# Patient Record
Sex: Female | Born: 1935 | Race: White | Hispanic: No | Marital: Single | Smoking: Former smoker
Health system: Southern US, Community
[De-identification: ages and names within clinical notes are randomized; demographics above are authoritative.]

## PROBLEM LIST (undated history)

## (undated) DIAGNOSIS — K5792 Diverticulitis of intestine, part unspecified, without perforation or abscess without bleeding: Secondary | ICD-10-CM

## (undated) DIAGNOSIS — I10 Essential (primary) hypertension: Secondary | ICD-10-CM

## (undated) DIAGNOSIS — E119 Type 2 diabetes mellitus without complications: Secondary | ICD-10-CM

## (undated) HISTORY — PX: APPENDECTOMY: SHX54

## (undated) HISTORY — PX: TONSILLECTOMY: SUR1361

---

## 1998-03-26 ENCOUNTER — Ambulatory Visit (HOSPITAL_COMMUNITY): Admission: RE | Admit: 1998-03-26 | Discharge: 1998-03-26 | Payer: Self-pay | Admitting: Family Medicine

## 1998-03-26 ENCOUNTER — Encounter: Payer: Self-pay | Admitting: Family Medicine

## 2002-07-01 ENCOUNTER — Encounter: Payer: Self-pay | Admitting: Family Medicine

## 2002-07-01 ENCOUNTER — Encounter: Admission: RE | Admit: 2002-07-01 | Discharge: 2002-07-01 | Payer: Self-pay | Admitting: Family Medicine

## 2004-02-26 ENCOUNTER — Emergency Department (HOSPITAL_COMMUNITY): Admission: EM | Admit: 2004-02-26 | Discharge: 2004-02-26 | Payer: Self-pay | Admitting: *Deleted

## 2010-05-31 ENCOUNTER — Emergency Department (INDEPENDENT_AMBULATORY_CARE_PROVIDER_SITE_OTHER): Payer: Medicare Other

## 2010-05-31 ENCOUNTER — Emergency Department (HOSPITAL_BASED_OUTPATIENT_CLINIC_OR_DEPARTMENT_OTHER)
Admission: EM | Admit: 2010-05-31 | Discharge: 2010-05-31 | Disposition: A | Discharge status: Home / Self Care | Payer: Medicare Other | Attending: Emergency Medicine | Admitting: Emergency Medicine

## 2010-05-31 DIAGNOSIS — S5000XA Contusion of unspecified elbow, initial encounter: Secondary | ICD-10-CM | POA: Insufficient documentation

## 2010-05-31 DIAGNOSIS — W19XXXA Unspecified fall, initial encounter: Secondary | ICD-10-CM | POA: Insufficient documentation

## 2010-05-31 DIAGNOSIS — Y92009 Unspecified place in unspecified non-institutional (private) residence as the place of occurrence of the external cause: Secondary | ICD-10-CM | POA: Insufficient documentation

## 2010-05-31 DIAGNOSIS — W108XXA Fall (on) (from) other stairs and steps, initial encounter: Secondary | ICD-10-CM

## 2010-05-31 DIAGNOSIS — M25529 Pain in unspecified elbow: Secondary | ICD-10-CM

## 2011-01-29 ENCOUNTER — Ambulatory Visit
Admission: RE | Admit: 2011-01-29 | Discharge: 2011-01-29 | Disposition: A | Discharge status: Home / Self Care | Payer: Medicare Other | Source: Ambulatory Visit | Attending: Family Medicine | Admitting: Family Medicine

## 2011-01-29 ENCOUNTER — Other Ambulatory Visit: Payer: Self-pay | Admitting: Family Medicine

## 2011-01-29 MED ORDER — IOHEXOL 300 MG/ML  SOLN
100.0000 mL | Freq: Once | INTRAMUSCULAR | Status: AC | PRN
  Administered 2011-01-29: 100 mL via INTRAVENOUS

## 2011-01-29 MED ORDER — IOHEXOL 300 MG/ML  SOLN
40.0000 mL | Freq: Once | INTRAMUSCULAR | Status: AC | PRN
  Administered 2011-01-29: 40 mL via ORAL

## 2011-02-04 ENCOUNTER — Other Ambulatory Visit: Payer: Self-pay | Admitting: Family Medicine

## 2011-02-14 ENCOUNTER — Ambulatory Visit
Admission: RE | Admit: 2011-02-14 | Discharge: 2011-02-14 | Disposition: A | Discharge status: Home / Self Care | Payer: Medicare Other | Source: Ambulatory Visit | Attending: Family Medicine | Admitting: Family Medicine

## 2011-08-01 ENCOUNTER — Other Ambulatory Visit: Payer: Self-pay | Admitting: Family Medicine

## 2011-08-01 DIAGNOSIS — R911 Solitary pulmonary nodule: Secondary | ICD-10-CM

## 2011-08-06 ENCOUNTER — Ambulatory Visit
Admission: RE | Admit: 2011-08-06 | Discharge: 2011-08-06 | Disposition: A | Discharge status: Home / Self Care | Payer: Medicare Other | Source: Ambulatory Visit | Attending: Family Medicine | Admitting: Family Medicine

## 2011-08-06 DIAGNOSIS — R911 Solitary pulmonary nodule: Secondary | ICD-10-CM

## 2012-08-17 ENCOUNTER — Other Ambulatory Visit: Payer: Self-pay | Admitting: Family Medicine

## 2012-08-17 DIAGNOSIS — R918 Other nonspecific abnormal finding of lung field: Secondary | ICD-10-CM

## 2012-08-19 ENCOUNTER — Other Ambulatory Visit: Payer: Medicare Other

## 2012-08-23 ENCOUNTER — Ambulatory Visit
Admission: RE | Admit: 2012-08-23 | Discharge: 2012-08-23 | Disposition: A | Discharge status: Home / Self Care | Payer: Medicare Other | Source: Ambulatory Visit | Attending: Family Medicine | Admitting: Family Medicine

## 2012-08-23 DIAGNOSIS — R918 Other nonspecific abnormal finding of lung field: Secondary | ICD-10-CM

## 2015-05-17 ENCOUNTER — Other Ambulatory Visit: Payer: Self-pay | Admitting: Nephrology

## 2015-05-17 DIAGNOSIS — N183 Chronic kidney disease, stage 3 unspecified: Secondary | ICD-10-CM

## 2015-05-21 ENCOUNTER — Ambulatory Visit
Admission: RE | Admit: 2015-05-21 | Discharge: 2015-05-21 | Disposition: A | Discharge status: Home / Self Care | Payer: Medicare Other | Source: Ambulatory Visit | Attending: Nephrology | Admitting: Nephrology

## 2015-05-21 DIAGNOSIS — N183 Chronic kidney disease, stage 3 unspecified: Secondary | ICD-10-CM

## 2016-05-19 ENCOUNTER — Emergency Department (HOSPITAL_COMMUNITY)
Admission: EM | Admit: 2016-05-19 | Discharge: 2016-05-19 | Disposition: A | Discharge status: Home / Self Care | Payer: Medicare Other | Attending: Emergency Medicine | Admitting: Emergency Medicine

## 2016-05-19 ENCOUNTER — Emergency Department (HOSPITAL_COMMUNITY): Payer: Medicare Other

## 2016-05-19 ENCOUNTER — Encounter (HOSPITAL_COMMUNITY): Payer: Self-pay | Admitting: *Deleted

## 2016-05-19 DIAGNOSIS — I10 Essential (primary) hypertension: Secondary | ICD-10-CM | POA: Insufficient documentation

## 2016-05-19 DIAGNOSIS — M545 Low back pain, unspecified: Secondary | ICD-10-CM

## 2016-05-19 DIAGNOSIS — E119 Type 2 diabetes mellitus without complications: Secondary | ICD-10-CM | POA: Insufficient documentation

## 2016-05-19 DIAGNOSIS — Z7982 Long term (current) use of aspirin: Secondary | ICD-10-CM | POA: Diagnosis not present

## 2016-05-19 DIAGNOSIS — R109 Unspecified abdominal pain: Secondary | ICD-10-CM | POA: Diagnosis not present

## 2016-05-19 DIAGNOSIS — F17210 Nicotine dependence, cigarettes, uncomplicated: Secondary | ICD-10-CM | POA: Diagnosis not present

## 2016-05-19 DIAGNOSIS — Z79899 Other long term (current) drug therapy: Secondary | ICD-10-CM | POA: Diagnosis not present

## 2016-05-19 HISTORY — DX: Type 2 diabetes mellitus without complications: E11.9

## 2016-05-19 HISTORY — DX: Essential (primary) hypertension: I10

## 2016-05-19 HISTORY — DX: Diverticulitis of intestine, part unspecified, without perforation or abscess without bleeding: K57.92

## 2016-05-19 LAB — URINALYSIS, ROUTINE W REFLEX MICROSCOPIC
Bilirubin Urine: NEGATIVE
Glucose, UA: NEGATIVE mg/dL
Hgb urine dipstick: NEGATIVE
Ketones, ur: NEGATIVE mg/dL
Leukocytes, UA: NEGATIVE
NITRITE: NEGATIVE
PH: 5 (ref 5.0–8.0)
Protein, ur: NEGATIVE mg/dL
SPECIFIC GRAVITY, URINE: 1.017 (ref 1.005–1.030)

## 2016-05-19 LAB — CBC
HCT: 40.6 % (ref 36.0–46.0)
Hemoglobin: 13.6 g/dL (ref 12.0–15.0)
MCH: 30.6 pg (ref 26.0–34.0)
MCHC: 33.5 g/dL (ref 30.0–36.0)
MCV: 91.4 fL (ref 78.0–100.0)
PLATELETS: 342 10*3/uL (ref 150–400)
RBC: 4.44 MIL/uL (ref 3.87–5.11)
RDW: 13 % (ref 11.5–15.5)
WBC: 7.4 10*3/uL (ref 4.0–10.5)

## 2016-05-19 LAB — BASIC METABOLIC PANEL
ANION GAP: 10 (ref 5–15)
Anion gap: 10 (ref 5–15)
BUN: 24 mg/dL — ABNORMAL HIGH (ref 6–20)
BUN: 23 mg/dL — ABNORMAL HIGH (ref 6–20)
CALCIUM: 9 mg/dL (ref 8.9–10.3)
CHLORIDE: 96 mmol/L — AB (ref 101–111)
CO2: 23 mmol/L (ref 22–32)
CO2: 25 mmol/L (ref 22–32)
Calcium: 8.9 mg/dL (ref 8.9–10.3)
Chloride: 96 mmol/L — ABNORMAL LOW (ref 101–111)
Creatinine, Ser: 1.95 mg/dL — ABNORMAL HIGH (ref 0.44–1.00)
Creatinine, Ser: 1.82 mg/dL — ABNORMAL HIGH (ref 0.44–1.00)
GFR calc non Af Amer: 22 mL/min — ABNORMAL LOW (ref >60)
GFR, EST AFRICAN AMERICAN: 26 mL/min — AB (ref >60)
GFR, EST AFRICAN AMERICAN: 28 mL/min — AB (ref >60)
GFR, EST NON AFRICAN AMERICAN: 24 mL/min — AB (ref >60)
Glucose, Bld: 128 mg/dL — ABNORMAL HIGH (ref 65–99)
Glucose, Bld: 90 mg/dL (ref 65–99)
POTASSIUM: 5.2 mmol/L — AB (ref 3.5–5.1)
POTASSIUM: 4.6 mmol/L (ref 3.5–5.1)
SODIUM: 129 mmol/L — AB (ref 135–145)
SODIUM: 131 mmol/L — AB (ref 135–145)

## 2016-05-19 MED ORDER — SODIUM CHLORIDE 0.9 % IV SOLN
INTRAVENOUS | Status: DC
  Administered 2016-05-19: 18:00:00 via INTRAVENOUS

## 2016-05-19 NOTE — ED Provider Notes (Signed)
[facility]-EMERGENCY DEPT Provider Note   CSN: [id number] Arrival date & time: 05/19/16  1127     History   Chief Complaint Chief Complaint  Patient presents with  . Flank Pain    HPI Cheryl Graves is a 83 y.o. female.  Patient evaluated by her primary care doctor on Friday had an x-ray that was suggestive of a left-sided kidney stone. Patient with complaint of left low back pain since then. Symptoms started on Thursday. Patient without any history of kidney stones. Urinalysis that time was suggestive of a urinary tract infection and patient was started on Bactrim. Patient's continued to have the pain continue have urinary frequency. Contacted her primary care doctor they recommended evaluation in ED. Patient was some nausea and diarrhea yesterday but no vomiting. No fevers. No blood in her urine currently. Patient's last dose the Bactrim was yesterday she did not complete a week's course.      Past Medical History:  Diagnosis Date  . Diabetes mellitus without complication (HCC)   . Diverticulitis   . Hypertension     There are no active problems to display for this patient.   Past Surgical History:  Procedure Laterality Date  . APPENDECTOMY      OB History    No data available       Home Medications    Prior to Admission medications   Medication Sig Start Date End Date Taking? Authorizing Provider  aspirin EC 81 MG tablet Take 81 mg by mouth at bedtime.   Yes Historical Provider, MD  sulfamethoxazole-trimethoprim (BACTRIM DS,SEPTRA DS) 800-160 MG tablet Take 1 tablet by mouth daily. 05/15/16 05/25/16  Historical Provider, MD    Family History No family history on file.  Social History Social History  Substance Use Topics  . Smoking status: Current Every Day Smoker  . Smokeless tobacco: Never Used  . Alcohol use No     Allergies   Ciprofloxacin; Metronidazole; and Septra [sulfamethoxazole-trimethoprim]   Review of Systems Review of Systems    Constitutional: Negative for fever.  HENT: Negative for congestion.   Eyes: Negative for redness.  Respiratory: Negative for shortness of breath.   Cardiovascular: Negative for chest pain.  Gastrointestinal: Negative for abdominal pain.  Genitourinary: Positive for flank pain and frequency.  Musculoskeletal: Positive for back pain.  Skin: Negative for rash.  Neurological: Negative for headaches.  Hematological: Does not bruise/bleed easily.  Psychiatric/Behavioral: Negative for confusion.     Physical Exam Updated Vital Signs BP (!) 134/95   Pulse 79   Temp 99 F (37.2 C) (Oral)   Resp 16   SpO2 100%   Physical Exam  Constitutional: She is oriented to person, place, and time. She appears well-developed and well-nourished. No distress.  HENT:  Head: Normocephalic and atraumatic.  Mouth/Throat: Oropharynx is clear and moist.  Eyes: EOM are normal. Pupils are equal, round, and reactive to light.  Neck: Normal range of motion. Neck supple.  Cardiovascular: Normal rate and regular rhythm.   Pulmonary/Chest: Effort normal and breath sounds normal.  Abdominal: Soft. Bowel sounds are normal. There is no tenderness.  Very slight very minimal tenderness left lower quadrant. No flank tenderness. No significant CVA tenderness.  Musculoskeletal: Normal range of motion. She exhibits no edema.  Neurological: She is alert and oriented to person, place, and time. No cranial nerve deficit or sensory deficit. She exhibits normal muscle tone. Coordination normal.  Skin: Skin is warm.  Nursing note and vitals reviewed.    ED Treatments /  Results  Labs (all labs ordered are listed, but only abnormal results are displayed) Labs Reviewed  URINALYSIS, ROUTINE W REFLEX MICROSCOPIC - Abnormal; Notable for the following:       Result Value   APPearance HAZY (*)    All other components within normal limits  BASIC METABOLIC PANEL - Abnormal; Notable for the following:    Sodium 129 (*)     Potassium 5.2 (*)    Chloride 96 (*)    Glucose, Bld 128 (*)    BUN 24 (*)    Creatinine, Ser 1.95 (*)    GFR calc non Af Amer 22 (*)    GFR calc Af Amer 26 (*)    All other components within normal limits  BASIC METABOLIC PANEL - Abnormal; Notable for the following:    Sodium 131 (*)    Chloride 96 (*)    BUN 23 (*)    Creatinine, Ser 1.82 (*)    GFR calc non Af Amer 24 (*)    GFR calc Af Amer 28 (*)    All other components within normal limits  CBC   Results for orders placed or performed during the hospital encounter of 05/19/16  Urinalysis, Routine w reflex microscopic- may I&O cath if menses  Result Value Ref Range   Color, Urine YELLOW YELLOW   APPearance HAZY (A) CLEAR   Specific Gravity, Urine 1.017 1.005 - 1.030   pH 5.0 5.0 - 8.0   Glucose, UA NEGATIVE NEGATIVE mg/dL   Hgb urine dipstick NEGATIVE NEGATIVE   Bilirubin Urine NEGATIVE NEGATIVE   Ketones, ur NEGATIVE NEGATIVE mg/dL   Protein, ur NEGATIVE NEGATIVE mg/dL   Nitrite NEGATIVE NEGATIVE   Leukocytes, UA NEGATIVE NEGATIVE  Basic metabolic panel  Result Value Ref Range   Sodium 129 (L) 135 - 145 mmol/L   Potassium 5.2 (H) 3.5 - 5.1 mmol/L   Chloride 96 (L) 101 - 111 mmol/L   CO2 23 22 - 32 mmol/L   Glucose, Bld 128 (H) 65 - 99 mg/dL   BUN 24 (H) 6 - 20 mg/dL   Creatinine, Ser 1.61 (H) 0.44 - 1.00 mg/dL   Calcium 8.9 8.9 - 09.6 mg/dL   GFR calc non Af Amer 22 (L) >60 mL/min   GFR calc Af Amer 26 (L) >60 mL/min   Anion gap 10 5 - 15  CBC  Result Value Ref Range   WBC 7.4 4.0 - 10.5 K/uL   RBC 4.44 3.87 - 5.11 MIL/uL   Hemoglobin 13.6 12.0 - 15.0 g/dL   HCT 04.5 40.9 - 81.1 %   MCV 91.4 78.0 - 100.0 fL   MCH 30.6 26.0 - 34.0 pg   MCHC 33.5 30.0 - 36.0 g/dL   RDW 91.4 78.2 - 95.6 %   Platelets 342 150 - 400 K/uL  Basic metabolic panel  Result Value Ref Range   Sodium 131 (L) 135 - 145 mmol/L   Potassium 4.6 3.5 - 5.1 mmol/L   Chloride 96 (L) 101 - 111 mmol/L   CO2 25 22 - 32 mmol/L   Glucose,  Bld 90 65 - 99 mg/dL   BUN 23 (H) 6 - 20 mg/dL   Creatinine, Ser 2.13 (H) 0.44 - 1.00 mg/dL   Calcium 9.0 8.9 - 08.6 mg/dL   GFR calc non Af Amer 24 (L) >60 mL/min   GFR calc Af Amer 28 (L) >60 mL/min   Anion gap 10 5 - 15     EKG  EKG  Interpretation None       Radiology Ct Renal Stone Study  Result Date: 05/19/2016 CLINICAL DATA:  Left-sided flank pain, hematuria EXAM: CT ABDOMEN AND PELVIS WITHOUT CONTRAST TECHNIQUE: Multidetector CT imaging of the abdomen and pelvis was performed following the standard protocol without IV contrast. COMPARISON:  CT abdomen pelvis of 09/19/2003 FINDINGS: Lower chest: There is mild linear atelectasis or scarring posteriorly in the right lung base. No pleural effusion is seen. The heart is mildly enlarged. There does appear to be a small hiatal hernia present. The mucosa at that level is somewhat thickened. This could be due to the hernia and esophagitis, but infiltrative tumor cannot be excluded. Hepatobiliary: The liver is unremarkable in the unenhanced state. No calcified gallstones are seen. Pancreas: The pancreas is unremarkable. The pancreatic duct is not dilated. Spleen: The spleen appears normal in size. Adrenals/Urinary Tract: The adrenal glands are slightly plump and low in attenuation possibly indicating small adrenal adenomas or adrenal hyperplasia. This appearance is unchanged compared to the prior CT from 2005 however. Stomach/Bowel: The stomach is moderately distended with fluid. No abnormality is seen other than the previously described probable hiatal hernia. No small bowel distention is seen. There are multiple rectosigmoid and distal descending colon diverticula present. Within the proximal sigmoid colon there is some pericolonic strandiness, and a very mild diverticulitis cannot be excluded without complicating features. Clinical correlation is recommended. Vascular/Lymphatic: Significant abdominal aortic atherosclerotic change is present. No  aneurysmal dilatation is seen. No adenopathy is noted. Reproductive: The uterus appears atrophic. No adnexal lesion is seen. No free fluid is seen within the pelvis. Other: None. Musculoskeletal: The lumbar vertebrae are in normal alignment with diffuse degenerative change and degenerative disc disease. IMPRESSION: 1. No renal or ureteral calculi are seen to explain the patient's left-sided flank pain and hematuria. 2. Very mild diverticulitis of the proximal sigmoid colon cannot be excluded. This is not a definite finding but minimal pericolonic strandiness is present in this patient with multiple primarily rectosigmoid colon diverticula. No complicating features are seen. 3. Probable hiatal hernia of small to moderate size but the mucosa appears somewhat thickened. Clinical correlation is recommended. 4. Significant abdominal aortic atherosclerosis. Electronically Signed   By: Dwyane Dee M.D.   On: 05/19/2016 16:40    Procedures Procedures (including critical care time)  Medications Ordered in ED Medications  0.9 %  sodium chloride infusion ( Intravenous New Bag/Given 05/19/16 1757)     Initial Impression / Assessment and Plan / ED Course  I have reviewed the triage vital signs and the nursing notes.  Pertinent labs & imaging results that were available during my care of the patient were reviewed by me and considered in my medical decision making (see chart for details).    Patient's workup here with no evidence of urinary tract infection currently. Perhaps the antibiotic at work successfully. CT scan shows no evidence of any renal stone or ureter stone. Does raise some question of maybe some slight inflammation left lower quadrant diverticulitis. Patient states she's had diverticulitis in the past and usually hurts much more than this. There is no leukocytosis no fevers.  Symptoms of her back very well could be consistent to muscular back pain. Will treat symptomatically and have her follow-up  with her regular doctor. Does not need any new antibiotics does not need to continue antibiotics.  Patient's electrolytes initial labs had an elevated potassium repeat labs showed normal potassium. For some slight renal insufficiency. Some mild hyponatremia which can be  followed up as an outpatient.   Final Clinical Impressions(s) / ED Diagnoses   Final diagnoses:  Acute left-sided low back pain without sciatica    New Prescriptions New Prescriptions   No medications on file     Vanetta Mulders, MD 05/19/16 1951

## 2016-05-19 NOTE — ED Notes (Addendum)
Patient transported to CT 

## 2016-05-19 NOTE — Discharge Instructions (Signed)
Workup here today without evidence of urinary tract infection. No evidence of kidney stone. Symptoms may just be left-sided low back pain. CT scan did suggest maybe some mild diverticular colitis. The patient without much left lower quadrant tenderness. We'll not restart her on any antibiotics. If symptoms get worse she may require treatment for diverticulitis.  Patient's sodium is still slightly low but does not require admission. Recommend recheck by primary care doctor in about a week.

## 2016-05-19 NOTE — ED Triage Notes (Addendum)
To ED via POV for eval of left flank pain since last week. States she was seen by her PCP on Friday and told she had a kidney stone that was dx via "xray". Pt also dx with UTI and given Bactrim. Called this am by PCP and told her urine culture came back, stop Bactrim, and a new rx was being calling into the pharmacy. Pain is intermittently worse - "sometimes it is and sometimes it's not". Pt alert and oriented. States she is urinating a lot more than usual but no pain with urination. No fevers at home. No abd pain. No vomiting. Pain is pinpoint to left flank. No hx of kidney stones that she is aware of. Laughing and joking in triage with staff

## 2016-05-19 NOTE — ED Notes (Signed)
Pt. Given food with EDP approval   

## 2016-05-19 NOTE — ED Notes (Signed)
Pt. Wheeled to restroom by RN.

## 2016-05-19 NOTE — ED Notes (Signed)
EDP at bedside  

## 2017-05-26 ENCOUNTER — Ambulatory Visit
Admission: RE | Admit: 2017-05-26 | Discharge: 2017-05-26 | Disposition: A | Discharge status: Home / Self Care | Payer: Medicare Other | Source: Ambulatory Visit | Attending: Family Medicine | Admitting: Family Medicine

## 2017-05-26 ENCOUNTER — Other Ambulatory Visit: Payer: Self-pay | Admitting: Family Medicine

## 2017-05-26 DIAGNOSIS — M545 Low back pain: Secondary | ICD-10-CM

## 2018-04-19 ENCOUNTER — Encounter: Payer: Self-pay | Admitting: Student

## 2018-04-19 NOTE — H&P (Signed)
TOTAL KNEE ADMISSION H&P  Patient is being admitted for right total knee arthroplasty.  Subjective:  Chief Complaint:right knee pain.  HPI: Cheryl Graves, 85 y.o. female, has a history of pain and functional disability in the right knee due to arthritis and has failed non-surgical conservative treatments for greater than 12 weeks to includecorticosteriod injections, viscosupplementation injections and activity modification.  Onset of symptoms was gradual, starting 3 years ago with gradually worsening course since that time. The patient noted no past surgery on the right knee(s).  Patient currently rates pain in the right knee(s) at 10 out of 10 with activity. Patient has worsening of pain with activity and weight bearing, pain that interferes with activities of daily living, joint swelling and instability..  Patient has evidence of bone on bone lateral compartment osteoarthritis with complete loss of joint space with a genu valgum deformity. Subchondral sclerosis and osteophytes laterally by imaging studies. There is no active infection.  There are no active problems to display for this patient.  Past Medical History:  Diagnosis Date  . Diabetes mellitus without complication (HCC)   . Diverticulitis   . Hypertension     Past Surgical History:  Procedure Laterality Date  . APPENDECTOMY      No current facility-administered medications for this encounter.    Current Outpatient Medications  Medication Sig Dispense Refill Last Dose  . aspirin EC 81 MG tablet Take 81 mg by mouth at bedtime.   05/18/2016 at Unknown time   Allergies  Allergen Reactions  . Ciprofloxacin Other (See Comments)  . Metronidazole Other (See Comments)  . Septra [Sulfamethoxazole-Trimethoprim] Diarrhea    Social History   Tobacco Use  . Smoking status: Current Every Day Smoker  . Smokeless tobacco: Never Used  Substance Use Topics  . Alcohol use: No    No family history on file.   ROS Constitutional:  Constitutional: no fever, chills, night sweats, or significant weight loss. Cardiovascular: Cardiovascular: no palpitations or chest pain. Respiratory: Respiratory: no cough or shortness of breath and No COPD. Gastrointestinal: Gastrointestinal: no vomiting or nausea. Musculoskeletal: Musculoskeletal: no swelling in Joints and Joint Pain. Neurologic: Neurologic: no numbness, tingling, or difficulty with balance.  Objective:  Physical Exam  Patient is an 85 year old female.  Well nourished and well developed. General: Alert and oriented x3, cooperative and pleasant, no acute distress. Head: normocephalic, atraumatic, neck supple. Eyes: EOMI. Respiratory: breath sounds clear in all fields, no wheezing, rales, or rhonchi. Cardiovascular: Regular rate and rhythm, no murmurs, gallops or rubs. Abdomen: non-tender to palpation and soft, normoactive bowel sounds.  Musculoskeletal: RIGHT KNEE EXAM Inspection: Intact skin, no rashes and no gross deformity Neurovascular: Intact sensation to light touch distally and brisk capillary refill Valgus right knee. No significant flexion contracture. Pain with varus correction of her valgus deformity. Tenderness over the lateral and anterior aspect the joint. She has flexion to 120 with some tightness.  Calves soft and nontender. Motor function intact in LE. Strength 5/5 LE bilaterally. Neuro: Distal pulses 2+. Sensation to light touch intact in LE.  Vital signs in last 24 hours:  Labs:   There is no height or weight on file to calculate BMI.   Imaging Review Plain radiographs demonstrate severe degenerative joint disease of the right knee(s). The overall alignment ismild valgus. The bone quality appears to be adequate for age and reported activity level.      Assessment/Plan:  End stage arthritis, right knee   The patient history, physical examination, clinical judgment of  the provider and imaging studies are consistent with end stage  degenerative joint disease of the right knee(s) and total knee arthroplasty is deemed medically necessary. The treatment options including medical management, injection therapy arthroscopy and arthroplasty were discussed at length. The risks and benefits of total knee arthroplasty were presented and reviewed. The risks due to aseptic loosening, infection, stiffness, patella tracking problems, thromboembolic complications and other imponderables were discussed. The patient acknowledged the explanation, agreed to proceed with the plan and consent was signed. Patient is being admitted for inpatient treatment for surgery, pain control, PT, OT, prophylactic antibiotics, VTE prophylaxis, progressive ambulation and ADL's and discharge planning. The patient is planning to be discharged home.    Anticipated LOS equal to or greater than 2 midnights due to - Age 65 and older with one or more of the following:  - Obesity  - Expected need for hospital services (PT, OT, Nursing) required for safe  discharge  - Anticipated need for postoperative skilled nursing care or inpatient rehab  - Active co-morbidities: Diabetes OR   - Unanticipated findings during/Post Surgery: None  - Patient is a high risk of re-admission due to: None    Therapy Plans: HHPT then outpatient therapy [facility] Disposition: Home with great niece  Planned DVT Prophylaxis: aspirin 81mg  BID DME needed: none PCP: Dr. Arlyce Dice  TXA: IV Allergies: NKDA  Anesthesia Concerns: none BMI: 24.9  Last HgbA1c: 5.8%   Other: Norco  - Patient was instructed on what medications to stop prior to surgery. - Follow-up visit in 2 weeks with Dr. Charlann Boxer - Begin physical therapy following surgery - Pre-operative lab work as pre-surgical testing - Prescriptions will be provided in hospital at time of discharge  Dennie Bible, PA-C Orthopedic Surgery [facility] [place]

## 2018-05-03 NOTE — Patient Instructions (Signed)
Cheryl Graves  05/03/2018   Your procedure is scheduled on: 05-11-18    Report to [facility] Main  Entrance    Report to Admitting at 10:25 AM    Call this number if you have problems the morning of surgery [phone]    Remember: Do not eat food or drink liquids :After Midnight.    BRUSH YOUR TEETH MORNING OF SURGERY AND RINSE YOUR MOUTH OUT, NO CHEWING GUM CANDY OR MINTS.     Take these medicines the morning of surgery with A SIP OF WATER: None    DO NOT TAKE ANY DIABETIC MEDICATIONS DAY OF YOUR SURGERY                               You may not have any metal on your body including hair pins and              piercings  Do not wear jewelry, make-up, lotions, powders or perfumes, deodorant             Do not wear nail polish.  Do not shave  48 hours prior to surgery.             Do not bring valuables to the hospital. [place] IS NOT             RESPONSIBLE   FOR VALUABLES.  Contacts, dentures or bridgework may not be worn into surgery.  Leave suitcase in the car. After surgery it may be brought to your room.     Patients discharged the day of surgery will not be allowed to drive home. IF YOU ARE HAVING SURGERY AND GOING HOME THE SAME DAY, YOU MUST HAVE AN ADULT TO DRIVE YOU HOME AND BE WITH YOU FOR 24 HOURS. YOU MAY GO HOME BY TAXI OR UBER OR ORTHERWISE, BUT AN ADULT MUST ACCOMPANY YOU HOME AND STAY WITH YOU FOR 24 HOURS.    Special Instructions: N/A              Please read over the following fact sheets you were given: _____________________________________________________________________             [facility] - Preparing for Surgery Before surgery, you can play an important role.  Because skin is not sterile, your skin needs to be as free of germs as possible.  You can reduce the number of germs on your skin by washing with CHG (chlorahexidine gluconate) soap before surgery.  CHG is an antiseptic cleaner which kills germs and bonds with  the skin to continue killing germs even after washing. Please DO NOT use if you have an allergy to CHG or antibacterial soaps.  If your skin becomes reddened/irritated stop using the CHG and inform your nurse when you arrive at Short Stay. Do not shave (including legs and underarms) for at least 48 hours prior to the first CHG shower.  You may shave your face/neck. Please follow these instructions carefully:  1.  Shower with CHG Soap the night before surgery and the  morning of Surgery.  2.  If you choose to wash your hair, wash your hair first as usual with your  normal  shampoo.  3.  After you shampoo, rinse your hair and body thoroughly to remove the  shampoo.  4.  Use CHG as you would any other liquid soap.  You can apply chg directly  to the skin and wash                       Gently with a scrungie or clean washcloth.  5.  Apply the CHG Soap to your body ONLY FROM THE NECK DOWN.   Do not use on face/ open                           Wound or open sores. Avoid contact with eyes, ears mouth and genitals (private parts).                       Wash face,  Genitals (private parts) with your normal soap.             6.  Wash thoroughly, paying special attention to the area where your surgery  will be performed.  7.  Thoroughly rinse your body with warm water from the neck down.  8.  DO NOT shower/wash with your normal soap after using and rinsing off  the CHG Soap.                9.  Pat yourself dry with a clean towel.            10.  Wear clean pajamas.            11.  Place clean sheets on your bed the night of your first shower and do not  sleep with pets. Day of Surgery : Do not apply any lotions/deodorants the morning of surgery.  Please wear clean clothes to the hospital/surgery center.  FAILURE TO FOLLOW THESE INSTRUCTIONS MAY RESULT IN THE CANCELLATION OF YOUR SURGERY PATIENT SIGNATURE_________________________________  NURSE  SIGNATURE__________________________________  ________________________________________________________________________   Adam Phenix  An incentive spirometer is a tool that can help keep your lungs clear and active. This tool measures how well you are filling your lungs with each breath. Taking long deep breaths may help reverse or decrease the chance of developing breathing (pulmonary) problems (especially infection) following:  A long period of time when you are unable to move or be active. BEFORE THE PROCEDURE   If the spirometer includes an indicator to show your best effort, your nurse or respiratory therapist will set it to a desired goal.  If possible, sit up straight or lean slightly forward. Try not to slouch.  Hold the incentive spirometer in an upright position. INSTRUCTIONS FOR USE  1. Sit on the edge of your bed if possible, or sit up as far as you can in bed or on a chair. 2. Hold the incentive spirometer in an upright position. 3. Breathe out normally. 4. Place the mouthpiece in your mouth and seal your lips tightly around it. 5. Breathe in slowly and as deeply as possible, raising the piston or the ball toward the top of the column. 6. Hold your breath for 3-5 seconds or for as long as possible. Allow the piston or ball to fall to the bottom of the column. 7. Remove the mouthpiece from your mouth and breathe out normally. 8. Rest for a few seconds and repeat Steps 1 through 7 at least 10 times every 1-2 hours when you are awake. Take your time and take a few normal breaths between deep breaths. 9. The spirometer may include an indicator to show  your best effort. Use the indicator as a goal to work toward during each repetition. 10. After each set of 10 deep breaths, practice coughing to be sure your lungs are clear. If you have an incision (the cut made at the time of surgery), support your incision when coughing by placing a pillow or rolled up towels firmly  against it. Once you are able to get out of bed, walk around indoors and cough well. You may stop using the incentive spirometer when instructed by your caregiver.  RISKS AND COMPLICATIONS  Take your time so you do not get dizzy or light-headed.  If you are in pain, you may need to take or ask for pain medication before doing incentive spirometry. It is harder to take a deep breath if you are having pain. AFTER USE  Rest and breathe slowly and easily.  It can be helpful to keep track of a log of your progress. Your caregiver can provide you with a simple table to help with this. If you are using the spirometer at home, follow these instructions: [place] IF:   You are having difficultly using the spirometer.  You have trouble using the spirometer as often as instructed.  Your pain medication is not giving enough relief while using the spirometer.  You develop fever of 100.5 F (38.1 C) or higher. SEEK IMMEDIATE MEDICAL CARE IF:   You cough up bloody sputum that had not been present before.  You develop fever of 102 F (38.9 C) or greater.  You develop worsening pain at or near the incision site. MAKE SURE YOU:   Understand these instructions.  Will watch your condition.  Will get help right away if you are not doing well or get worse. Document Released: 06/16/2006 Document Revised: 04/28/2011 Document Reviewed: 08/17/2006 ExitCare Patient Information 2014 ExitCare, [place].   ________________________________________________________________________  WHAT IS A BLOOD TRANSFUSION? Blood Transfusion Information  A transfusion is the replacement of blood or some of its parts. Blood is made up of multiple cells which provide different functions.  Red blood cells carry oxygen and are used for blood loss replacement.  White blood cells fight against infection.  Platelets control bleeding.  Plasma helps clot blood.  Other blood products are available for  specialized needs, such as hemophilia or other clotting disorders. BEFORE THE TRANSFUSION  Who gives blood for transfusions?   Healthy volunteers who are fully evaluated to make sure their blood is safe. This is blood bank blood. Transfusion therapy is the safest it has ever been in the practice of medicine. Before blood is taken from a donor, a complete history is taken to make sure that person has no history of diseases nor engages in risky social behavior (examples are intravenous drug use or sexual activity with multiple partners). The donor's travel history is screened to minimize risk of transmitting infections, such as malaria. The donated blood is tested for signs of infectious diseases, such as HIV and hepatitis. The blood is then tested to be sure it is compatible with you in order to minimize the chance of a transfusion reaction. If you or a relative donates blood, this is often done in anticipation of surgery and is not appropriate for emergency situations. It takes many days to process the donated blood. RISKS AND COMPLICATIONS Although transfusion therapy is very safe and saves many lives, the main dangers of transfusion include:   Getting an infectious disease.  Developing a transfusion reaction. This is an allergic reaction to  something in the blood you were given. Every precaution is taken to prevent this. The decision to have a blood transfusion has been considered carefully by your caregiver before blood is given. Blood is not given unless the benefits outweigh the risks. AFTER THE TRANSFUSION  Right after receiving a blood transfusion, you will usually feel much better and more energetic. This is especially true if your red blood cells have gotten low (anemic). The transfusion raises the level of the red blood cells which carry oxygen, and this usually causes an energy increase.  The nurse administering the transfusion will monitor you carefully for complications. HOME CARE  INSTRUCTIONS  No special instructions are needed after a transfusion. You may find your energy is better. Speak with your caregiver about any limitations on activity for underlying diseases you may have. SEEK MEDICAL CARE IF:   Your condition is not improving after your transfusion.  You develop redness or irritation at the intravenous (IV) site. SEEK IMMEDIATE MEDICAL CARE IF:  Any of the following symptoms occur over the next 12 hours:  Shaking chills.  You have a temperature by mouth above 102 F (38.9 C), not controlled by medicine.  Chest, back, or muscle pain.  People around you feel you are not acting correctly or are confused.  Shortness of breath or difficulty breathing.  Dizziness and fainting.  You get a rash or develop hives.  You have a decrease in urine output.  Your urine turns a dark color or changes to pink, red, or brown. Any of the following symptoms occur over the next 10 days:  You have a temperature by mouth above 102 F (38.9 C), not controlled by medicine.  Shortness of breath.  Weakness after normal activity.  The white part of the eye turns yellow (jaundice).  You have a decrease in the amount of urine or are urinating less often.  Your urine turns a dark color or changes to pink, red, or brown. Document Released: 02/01/2000 Document Revised: 04/28/2011 Document Reviewed: 09/20/2007 [facility] Patient Information 2014 [place], [place].  _______________________________________________________________________

## 2018-05-04 ENCOUNTER — Encounter (HOSPITAL_COMMUNITY)
Admission: RE | Admit: 2018-05-04 | Discharge: 2018-05-04 | Disposition: A | Discharge status: Home / Self Care | Payer: Medicare Other | Source: Ambulatory Visit

## 2018-05-11 ENCOUNTER — Inpatient Hospital Stay (HOSPITAL_COMMUNITY): Admission: RE | Admit: 2018-05-11 | Payer: Medicare Other | Source: Home / Self Care | Admitting: Orthopedic Surgery

## 2018-05-11 ENCOUNTER — Encounter (HOSPITAL_COMMUNITY): Admission: RE | Payer: Self-pay | Source: Home / Self Care

## 2018-05-11 SURGERY — ARTHROPLASTY, KNEE, TOTAL
Anesthesia: Spinal | Laterality: Right

## 2018-11-13 ENCOUNTER — Inpatient Hospital Stay (HOSPITAL_COMMUNITY)
Admission: EM | Admit: 2018-11-13 | Discharge: 2018-11-18 | DRG: 690 | Disposition: A | Discharge status: Skilled Nursing Facility | Payer: Medicare Other | Attending: Family Medicine | Admitting: Family Medicine

## 2018-11-13 ENCOUNTER — Other Ambulatory Visit: Payer: Self-pay

## 2018-11-13 ENCOUNTER — Encounter (HOSPITAL_COMMUNITY): Payer: Self-pay

## 2018-11-13 DIAGNOSIS — Z79899 Other long term (current) drug therapy: Secondary | ICD-10-CM

## 2018-11-13 DIAGNOSIS — N390 Urinary tract infection, site not specified: Secondary | ICD-10-CM | POA: Diagnosis not present

## 2018-11-13 DIAGNOSIS — R5381 Other malaise: Secondary | ICD-10-CM

## 2018-11-13 DIAGNOSIS — E871 Hypo-osmolality and hyponatremia: Secondary | ICD-10-CM | POA: Diagnosis present

## 2018-11-13 DIAGNOSIS — R531 Weakness: Secondary | ICD-10-CM

## 2018-11-13 DIAGNOSIS — I10 Essential (primary) hypertension: Secondary | ICD-10-CM | POA: Diagnosis present

## 2018-11-13 DIAGNOSIS — Z7982 Long term (current) use of aspirin: Secondary | ICD-10-CM

## 2018-11-13 DIAGNOSIS — E860 Dehydration: Secondary | ICD-10-CM | POA: Diagnosis present

## 2018-11-13 DIAGNOSIS — R110 Nausea: Secondary | ICD-10-CM | POA: Diagnosis present

## 2018-11-13 DIAGNOSIS — N3289 Other specified disorders of bladder: Secondary | ICD-10-CM | POA: Diagnosis present

## 2018-11-13 DIAGNOSIS — Z20828 Contact with and (suspected) exposure to other viral communicable diseases: Secondary | ICD-10-CM | POA: Diagnosis present

## 2018-11-13 DIAGNOSIS — E119 Type 2 diabetes mellitus without complications: Secondary | ICD-10-CM | POA: Diagnosis present

## 2018-11-13 DIAGNOSIS — Z882 Allergy status to sulfonamides status: Secondary | ICD-10-CM

## 2018-11-13 DIAGNOSIS — N3941 Urge incontinence: Secondary | ICD-10-CM | POA: Diagnosis present

## 2018-11-13 DIAGNOSIS — F17200 Nicotine dependence, unspecified, uncomplicated: Secondary | ICD-10-CM | POA: Diagnosis present

## 2018-11-13 DIAGNOSIS — Z7984 Long term (current) use of oral hypoglycemic drugs: Secondary | ICD-10-CM

## 2018-11-13 DIAGNOSIS — I160 Hypertensive urgency: Secondary | ICD-10-CM | POA: Diagnosis present

## 2018-11-13 DIAGNOSIS — Z881 Allergy status to other antibiotic agents status: Secondary | ICD-10-CM

## 2018-11-13 LAB — CBC
HCT: 38.5 % (ref 36.0–46.0)
Hemoglobin: 13.2 g/dL (ref 12.0–15.0)
MCH: 31.7 pg (ref 26.0–34.0)
MCHC: 34.3 g/dL (ref 30.0–36.0)
MCV: 92.5 fL (ref 80.0–100.0)
Platelets: 351 10*3/uL (ref 150–400)
RBC: 4.16 MIL/uL (ref 3.87–5.11)
RDW: 12.2 % (ref 11.5–15.5)
WBC: 11.1 10*3/uL — ABNORMAL HIGH (ref 4.0–10.5)
nRBC: 0 % (ref 0.0–0.2)

## 2018-11-13 LAB — BASIC METABOLIC PANEL
Anion gap: 11 (ref 5–15)
BUN: 20 mg/dL (ref 8–23)
CO2: 26 mmol/L (ref 22–32)
Calcium: 8.6 mg/dL — ABNORMAL LOW (ref 8.9–10.3)
Chloride: 89 mmol/L — ABNORMAL LOW (ref 98–111)
Creatinine, Ser: 1.18 mg/dL — ABNORMAL HIGH (ref 0.44–1.00)
GFR calc Af Amer: 48 mL/min — ABNORMAL LOW (ref >60)
GFR calc non Af Amer: 41 mL/min — ABNORMAL LOW (ref >60)
Glucose, Bld: 144 mg/dL — ABNORMAL HIGH (ref 70–99)
Potassium: 4 mmol/L (ref 3.5–5.1)
Sodium: 126 mmol/L — ABNORMAL LOW (ref 135–145)

## 2018-11-13 LAB — URINALYSIS, ROUTINE W REFLEX MICROSCOPIC
Bilirubin Urine: NEGATIVE
Glucose, UA: NEGATIVE mg/dL
Ketones, ur: NEGATIVE mg/dL
Nitrite: POSITIVE — AB
Protein, ur: NEGATIVE mg/dL
Specific Gravity, Urine: 1.009 (ref 1.005–1.030)
WBC, UA: >50 WBC/hpf — ABNORMAL HIGH (ref 0–5)
pH: 6 (ref 5.0–8.0)

## 2018-11-13 MED ORDER — SODIUM CHLORIDE 0.9% FLUSH
3.0000 mL | Freq: Once | INTRAVENOUS | Status: AC
  Administered 2018-11-13: 3 mL via INTRAVENOUS

## 2018-11-13 MED ORDER — SODIUM CHLORIDE 0.9 % IV SOLN
INTRAVENOUS | Status: AC
  Administered 2018-11-14: 01:00:00 via INTRAVENOUS

## 2018-11-13 MED ORDER — HYDRALAZINE HCL 20 MG/ML IJ SOLN
5.0000 mg | INTRAMUSCULAR | Status: DC | PRN
  Administered 2018-11-14 – 2018-11-16 (×6): 5 mg via INTRAVENOUS
  Filled 2018-11-13 (×6): qty 1

## 2018-11-13 MED ORDER — SODIUM CHLORIDE 0.9 % IV SOLN
1.0000 g | Freq: Every day | INTRAVENOUS | Status: AC
  Administered 2018-11-14 – 2018-11-15 (×3): 1 g via INTRAVENOUS
  Filled 2018-11-13 (×3): qty 10

## 2018-11-13 MED ORDER — ACETAMINOPHEN 325 MG PO TABS
650.0000 mg | ORAL_TABLET | Freq: Four times a day (QID) | ORAL | Status: DC | PRN
  Administered 2018-11-15: 650 mg via ORAL
  Filled 2018-11-13 (×2): qty 2

## 2018-11-13 MED ORDER — ACETAMINOPHEN 650 MG RE SUPP: 650.0000 mg | Freq: Four times a day (QID) | RECTAL | Status: DC | PRN

## 2018-11-13 MED ORDER — SODIUM CHLORIDE 0.9 % IV BOLUS
1000.0000 mL | Freq: Once | INTRAVENOUS | Status: AC
  Administered 2018-11-13: 1000 mL via INTRAVENOUS

## 2018-11-13 MED ORDER — ENOXAPARIN SODIUM 40 MG/0.4ML ~~LOC~~ SOLN
40.0000 mg | SUBCUTANEOUS | Status: DC
  Administered 2018-11-14 – 2018-11-17 (×4): 40 mg via SUBCUTANEOUS
  Filled 2018-11-13 (×4): qty 0.4

## 2018-11-13 MED ORDER — INSULIN ASPART 100 UNIT/ML ~~LOC~~ SOLN
0.0000 [IU] | Freq: Three times a day (TID) | SUBCUTANEOUS | Status: DC
  Administered 2018-11-14 – 2018-11-15 (×2): 2 [IU] via SUBCUTANEOUS
  Administered 2018-11-15: 1 [IU] via SUBCUTANEOUS
  Administered 2018-11-16 (×2): 2 [IU] via SUBCUTANEOUS
  Administered 2018-11-17: 1 [IU] via SUBCUTANEOUS
  Administered 2018-11-17: 2 [IU] via SUBCUTANEOUS
  Administered 2018-11-17 – 2018-11-18 (×2): 1 [IU] via SUBCUTANEOUS

## 2018-11-13 MED ORDER — INSULIN ASPART 100 UNIT/ML ~~LOC~~ SOLN: 0.0000 [IU] | Freq: Every day | SUBCUTANEOUS | Status: DC

## 2018-11-13 NOTE — H&P (Addendum)
History and Physical    Cheryl Graves DOB: October 14, 1928 DOA: 11/13/2018  [place], [facility] Patient coming from: Home  Chief Complaint: Generalized weakness  HPI: Cheryl Graves is a 85 y.o. female with medical history significant of type 2 diabetes, hypertension presenting to the ED for evaluation of generalized weakness.  History provided by patient and her niece at bedside.  Patient was initially diagnosed with a UTI about 2 weeks ago and treated with an antibiotic.  She continued to be symptomatic so then another antibiotic (Macrobid) was prescribed.  Patient continues to feel tired, lightheaded, and has no appetite.  She has been feeling nauseous but has not vomited.  Denies dysuria.  Does report urinary incontinence.  Denies fevers or chills.  Denies headaches, chest pain, shortness of breath, abdominal pain, or diarrhea.  She takes one blood pressure medication at home which is lisinopril-hydrochlorothiazide.  ED Course: Blood pressure significantly elevated, remainder of vitals stable.  White count mildly elevated at 11.1.  Corrected sodium 127.  Creatinine 1.1, improved since labs done 2 years ago when it was 1.8.  UA with positive nitrite, large amount of leukocytes, and greater than 50 WBCs. Patient received 1 L normal saline.  Review of Systems:  All systems reviewed and apart from history of presenting illness, are negative.  Past Medical History:  Diagnosis Date  . Diabetes mellitus without complication (HCC)   . Diverticulitis   . Hypertension     Past Surgical History:  Procedure Laterality Date  . APPENDECTOMY       reports that she has been smoking. She has never used smokeless tobacco. She reports that she does not drink alcohol or use drugs.  Allergies  Allergen Reactions  . Ciprofloxacin Diarrhea and Nausea Only  . Metronidazole Diarrhea and Nausea Only  . Septra [Sulfamethoxazole-Trimethoprim] Diarrhea    History  reviewed. No pertinent family history.  Prior to Admission medications   Medication Sig Start Date End Date Taking? Authorizing Provider  acetaminophen (TYLENOL) 325 MG tablet Take 650 mg by mouth every 6 (six) hours as needed for moderate pain.    [provider]  aspirin EC 81 MG tablet Take 81 mg by mouth at bedtime.    [provider]  diclofenac (VOLTAREN) 50 MG EC tablet Take 50 mg by mouth 2 (two) times daily.    [provider]  glimepiride (AMARYL) 2 MG tablet Take 2 mg by mouth daily with breakfast.    [provider]  lisinopril-hydrochlorothiazide (PRINZIDE,ZESTORETIC) 20-12.5 MG tablet Take 1 tablet by mouth daily.    [provider]  nizatidine (AXID) 150 MG capsule Take 150 mg by mouth 2 (two) times daily.    [provider]    Physical Exam: Vitals:   11/13/18 2224 11/13/18 2230 11/13/18 2300 11/13/18 2322  BP: (!) 191/73 (!) 165/64 (!) 193/71 (!) 198/66  Pulse: 63 (!) 45 65 60  Resp: 20 20 14    Temp:      TempSrc: Oral     SpO2: 96% 94% 97% 98%    Physical Exam  Constitutional: She is oriented to person, place, and time. She appears well-developed and well-nourished. No distress.  HENT:  Head: Normocephalic.  Dry mucous membranes  Eyes: Right eye exhibits no discharge. Left eye exhibits no discharge.  Neck: Neck supple.  Cardiovascular: Normal rate, regular rhythm and intact distal pulses.  Pulmonary/Chest: Effort normal and breath sounds normal. No respiratory distress. She has no wheezes. She  has no rales.  Abdominal: Soft. Bowel sounds are normal. She exhibits no distension. There is no abdominal tenderness. There is no guarding.  Musculoskeletal:        General: No edema.  Neurological: She is alert and oriented to person, place, and time.  No focal weakness  Skin: Skin is warm and dry. She is not diaphoretic.     Labs on Admission: I have personally reviewed following labs and imaging studies  CBC:  Recent Labs  Lab 11/13/18 2132  WBC 11.1*  HGB 13.2  HCT 38.5  MCV 92.5  PLT 858   Basic Metabolic Panel: Recent Labs  Lab 11/13/18 2132  NA 126*  K 4.0  CL 89*  CO2 26  GLUCOSE 144*  BUN 20  CREATININE 1.18*  CALCIUM 8.6*   GFR: CrCl cannot be calculated (Unknown ideal weight.). Liver Function Tests: No results for input(s): AST, ALT, ALKPHOS, BILITOT, PROT, ALBUMIN in the last 168 hours. No results for input(s): LIPASE, AMYLASE in the last 168 hours. No results for input(s): AMMONIA in the last 168 hours. Coagulation Profile: No results for input(s): INR, PROTIME in the last 168 hours. Cardiac Enzymes: No results for input(s): CKTOTAL, CKMB, CKMBINDEX, TROPONINI in the last 168 hours. BNP (last 3 results) No results for input(s): PROBNP in the last 8760 hours. HbA1C: No results for input(s): HGBA1C in the last 72 hours. CBG: No results for input(s): GLUCAP in the last 168 hours. Lipid Profile: No results for input(s): CHOL, HDL, LDLCALC, TRIG, CHOLHDL, LDLDIRECT in the last 72 hours. Thyroid Function Tests: No results for input(s): TSH, T4TOTAL, FREET4, T3FREE, THYROIDAB in the last 72 hours. Anemia Panel: No results for input(s): VITAMINB12, FOLATE, FERRITIN, TIBC, IRON, RETICCTPCT in the last 72 hours. Urine analysis:    Component Value Date/Time   COLORURINE YELLOW 11/13/2018 2200   APPEARANCEUR CLOUDY (A) 11/13/2018 2200   LABSPEC 1.009 11/13/2018 2200   PHURINE 6.0 11/13/2018 2200   GLUCOSEU NEGATIVE 11/13/2018 2200   HGBUR SMALL (A) 11/13/2018 2200   BILIRUBINUR NEGATIVE 11/13/2018 2200   KETONESUR NEGATIVE 11/13/2018 2200   PROTEINUR NEGATIVE 11/13/2018 2200   NITRITE POSITIVE (A) 11/13/2018 2200   LEUKOCYTESUR LARGE (A) 11/13/2018 2200    Radiological Exams on Admission: No results found.  EKG: Independently reviewed.  Normal sinus rhythm.  Assessment/Plan Principal Problem:   UTI (urinary tract infection) Active Problems:   Hyponatremia    Type 2 diabetes mellitus ([place])   Physical deconditioning   Hypertensive urgency   UTI Patient failed recent outpatient antibiotic treatment for UTI.  Afebrile.  White count mildly elevated at 11.1. UA with positive nitrite, large amount of leukocytes, and greater than 50 WBCs.  -Ceftriaxone -Urine culture -Continue to monitor white count  Hypertensive urgency Blood pressure significantly elevated with systolic up to 850.  Patient is currently taking lisinopril 20 mg and hydrochlorothiazide 12.5 mg at home.  Asymptomatic.  No complaints of headache, chest pain, shortness of breath, or abdominal pain.  No evidence of AKI. -IV hydralazine PRN SBP >160  Acute on chronic hyponatremia Corrected sodium 127.  Sodium 129-131 on labs done in 2018.  No recent labs for comparison.  Hyponatremia likely related to decreased p.o. intake and home hydrochlorothiazide diuretic use.  Appears dry on exam. -Gentle IV fluid hydration -Serial BMPs -Check serum osmolarity -Hold home diuretic  Non-insulin-dependent diabetes mellitus -Check A1c.  Sliding scale insulin sensitive and CBG checks.  Physical deconditioning Likely related to UTI and dehydration. -PT evaluation  Pharmacy med rec pending.  DVT prophylaxis: Lovenox Code Status: Patient wishes to be full code. Family Communication: Niece at bedside. Disposition Plan: Anticipate discharge in 1 to 2 days. Consults called: None Admission status: It is my clinical opinion that referral for OBSERVATION is reasonable and necessary in this patient based on the above information provided. The aforementioned taken together are felt to place the patient at high risk for further clinical deterioration. However it is anticipated that the patient may be medically stable for discharge from the hospital within 24 to 48 hours.  The medical decision making on this patient was of high complexity and the patient is at high risk for clinical deterioration,  therefore this is a level 3 visit.  John Giovanni MD [facility] Pager [phone]  If 7PM-7AM, please contact night-coverage [email or web address] Password [facility]  11/14/2018, 12:32 AM

## 2018-11-13 NOTE — ED Provider Notes (Signed)
[facility] EMERGENCY DEPARTMENT Provider Note   CSN: [id number] Arrival date & time: 11/13/18  2115     History   Chief Complaint Chief Complaint  Patient presents with  . Weakness    HPI Cheryl Graves is a 85 y.o. female with Hx of DM and HTN presenting to the ED with complaints of fatigue and dizziness for one week with worsening urinary incontinence and decreased PO intake for one day. Patient was found to have a UTI one week ago and has been taking nitrofurantoin. However, she continues to have fatigue and reports dizziness, especially on standing from seated position. Patient has urinary incontinence at baseline and wears depends. However, she noted that there has been increased incidences of incontinence since last night. Her niece, who is also her primary caretaker, is at bedside and also endorses decreased PO intake for one day duration. Patient denies any fever, chills, nausea, vomiting, abdominal pain or fullness, dysuria, back pain or focal weakness.   Past Medical History:  Diagnosis Date  . Diabetes mellitus without complication (HCC)   . Diverticulitis   . Hypertension     Patient Active Problem List   Diagnosis Date Noted  . UTI (urinary tract infection) 11/13/2018    Past Surgical History:  Procedure Laterality Date  . APPENDECTOMY       OB History   No obstetric history on file.      Home Medications    Prior to Admission medications   Medication Sig Start Date End Date Taking? Authorizing Provider  acetaminophen (TYLENOL) 325 MG tablet Take 650 mg by mouth every 6 (six) hours as needed for moderate pain.    [provider]  aspirin EC 81 MG tablet Take 81 mg by mouth at bedtime.    [provider]  diclofenac (VOLTAREN) 50 MG EC tablet Take 50 mg by mouth 2 (two) times daily.    [provider]  glimepiride (AMARYL) 2 MG tablet Take 2 mg by mouth daily with breakfast.    [provider]   lisinopril-hydrochlorothiazide (PRINZIDE,ZESTORETIC) 20-12.5 MG tablet Take 1 tablet by mouth daily.    [provider]  nizatidine (AXID) 150 MG capsule Take 150 mg by mouth 2 (two) times daily.    [provider]    Family History No family history on file.  Social History Social History   Tobacco Use  . Smoking status: Current Every Day Smoker  . Smokeless tobacco: Never Used  Substance Use Topics  . Alcohol use: No  . Drug use: No     Allergies   Ciprofloxacin, Metronidazole, and Septra [sulfamethoxazole-trimethoprim]   Review of Systems Review of Systems  Constitutional: Positive for fatigue. Negative for activity change, chills and fever.  HENT: Negative for congestion, postnasal drip, sinus pain, sneezing and sore throat.   Eyes: Negative for visual disturbance.  Respiratory: Negative for cough, chest tightness, shortness of breath and wheezing.   Cardiovascular: Negative for chest pain and palpitations.  Gastrointestinal: Negative for abdominal pain, constipation, diarrhea, nausea and vomiting.  Endocrine: Negative.   Genitourinary: Positive for frequency and urgency. Negative for difficulty urinating, dysuria, hematuria, pelvic pain, vaginal bleeding and vaginal discharge.       Urinary incontinence  Musculoskeletal: Negative for back pain and myalgias.  Skin: Negative.   Neurological: Positive for dizziness. Negative for tremors, speech difficulty, weakness, light-headedness, numbness and headaches.  Hematological: Negative.   Psychiatric/Behavioral: Negative.    Physical Exam Updated Vital Signs BP Marland Kitchen)  198/66   Pulse 60   Temp 97.9 F (36.6 C) (Oral)   Resp 14   SpO2 98%   Physical Exam Vitals signs reviewed.  Constitutional:      General: She is not in acute distress.    Appearance: Normal appearance. She is not diaphoretic.  HENT:     Head: Normocephalic and atraumatic.     Mouth/Throat:     Mouth: Mucous membranes are moist.      Pharynx: Oropharynx is clear. No oropharyngeal exudate or posterior oropharyngeal erythema.  Eyes:     General: No scleral icterus.    Extraocular Movements: Extraocular movements intact.     Conjunctiva/sclera: Conjunctivae normal.     Pupils: Pupils are equal, round, and reactive to light.  Neck:     Musculoskeletal: Normal range of motion and neck supple. No neck rigidity or muscular tenderness.  Cardiovascular:     Rate and Rhythm: Normal rate and regular rhythm.     Pulses: Normal pulses.     Heart sounds: Normal heart sounds. No murmur. No friction rub. No gallop.   Pulmonary:     Effort: Pulmonary effort is normal. No respiratory distress.     Breath sounds: Normal breath sounds. No wheezing or rales.  Abdominal:     General: Bowel sounds are normal. There is no distension.     Palpations: Abdomen is soft.     Tenderness: There is no abdominal tenderness. There is no guarding.  Musculoskeletal: Normal range of motion.        General: No swelling or tenderness.  Skin:    General: Skin is warm and dry.     Capillary Refill: Capillary refill takes less than 2 seconds.  Neurological:     General: No focal deficit present.     Mental Status: She is alert and oriented to person, place, and time.     Cranial Nerves: No cranial nerve deficit.     Sensory: No sensory deficit.     Motor: No weakness.  Psychiatric:        Mood and Affect: Mood normal.        Behavior: Behavior normal.        Thought Content: Thought content normal.        Judgment: Judgment normal.    ED Treatments / Results  Labs (all labs ordered are listed, but only abnormal results are displayed) Labs Reviewed  BASIC METABOLIC PANEL - Abnormal; Notable for the following components:      Result Value   Sodium 126 (*)    Chloride 89 (*)    Glucose, Bld 144 (*)    Creatinine, Ser 1.18 (*)    Calcium 8.6 (*)    GFR calc non Af Amer 41 (*)    GFR calc Af Amer 48 (*)    All other components within normal  limits  CBC - Abnormal; Notable for the following components:   WBC 11.1 (*)    All other components within normal limits  URINALYSIS, ROUTINE W REFLEX MICROSCOPIC - Abnormal; Notable for the following components:   APPearance CLOUDY (*)    Hgb urine dipstick SMALL (*)    Nitrite POSITIVE (*)    Leukocytes,Ua LARGE (*)    WBC, UA >50 (*)    Bacteria, UA RARE (*)    All other components within normal limits  SARS CORONAVIRUS 2 (TAT 6-24 HRS)  URINE CULTURE  HEMOGLOBIN A1C  CBC  OSMOLALITY  BASIC METABOLIC PANEL  BASIC METABOLIC  PANEL    EKG EKG Interpretation  Date/Time:  Saturday November 13 2018 21:28:48 EDT Ventricular Rate:  68 PR Interval:  154 QRS Duration: 94 QT Interval:  400 QTC Calculation: 425 R Axis:   43 Text Interpretation:  Normal sinus rhythm Normal ECG No significant change since last tracing Confirmed by Gwyneth SproutPlunkett, Whitney ([id number]) on 11/13/2018 10:30:38 PM   Radiology No results found.  Procedures Procedures (including critical care time)  Medications Ordered in ED Medications  enoxaparin (LOVENOX) injection 40 mg (has no administration in time range)  acetaminophen (TYLENOL) tablet 650 mg (has no administration in time range)    Or  acetaminophen (TYLENOL) suppository 650 mg (has no administration in time range)  insulin aspart (novoLOG) injection 0-9 Units (has no administration in time range)  insulin aspart (novoLOG) injection 0-5 Units (has no administration in time range)  cefTRIAXone (ROCEPHIN) 1 g in sodium chloride 0.9 % 100 mL IVPB (has no administration in time range)  hydrALAZINE (APRESOLINE) injection 5 mg (has no administration in time range)  0.9 %  sodium chloride infusion (has no administration in time range)  sodium chloride flush (NS) 0.9 % injection 3 mL (3 mLs Intravenous Given 11/13/18 2326)  sodium chloride 0.9 % bolus 1,000 mL (1,000 mLs Intravenous New Bag/Given 11/13/18 2326)     Initial Impression / Assessment and Plan /  ED Course  I have reviewed the triage vital signs and the nursing notes.  Pertinent labs & imaging results that were available during my care of the patient were reviewed by me and considered in my medical decision making (see chart for details).  Patient is a 85yo female with PMHx of DM and HTN presenting to the ED with one week of fatigue and dizziness/lightheadedness. She has had one and half week of urinary incontinence. She was seen by PCP last week for back pain, dysuria and frequency and was treated with Nitrofurantoin. She reports feeling better for few days but has since have had worsening of symptoms. Per niece at bedside who is the primary caretaker, she also has had decreased PO intake and increase in urinary incontinence. She denies any fevers, chills, nausea, vomiting, dysuria, abdominal pain, headaches, or focal weakness.   Patient is hypertensive but afebrile, nontachycardic and nontachypneic on exam. No abdominal tenderness noted on examination. No CVA tenderness noted. No focal neurologic deficits noted on examination and cardiopulmonary examination is normal. CBC with mild leukocytosis and UA with large leukocytes, nitrite positive and >50 WBC but rare bacteria. Hyponatremia on BMP (NA 126) with Cr of 1.18 (improved).   Urine culture sent. Patient started on Rocephin and IV fluids. Patient to be admitted to inpatient service for further workup and management.    Final Clinical Impressions(s) / ED Diagnoses   Final diagnoses:  None    ED Discharge Orders    None       Eliezer BottomAslam, Shivaan Tierno, MD 11/14/18 [id number]    Gwyneth SproutPlunkett, Whitney, MD 11/14/18 [id number]

## 2018-11-13 NOTE — ED Triage Notes (Signed)
Pt presents to ED via [facility] with generalized weakness x1 week, hx of UTI's having urinary frequency for the last few days. Usually uses a walker to get around but today is requiring more assistance

## 2018-11-14 ENCOUNTER — Encounter (HOSPITAL_COMMUNITY): Payer: Self-pay | Admitting: Internal Medicine

## 2018-11-14 DIAGNOSIS — R531 Weakness: Secondary | ICD-10-CM

## 2018-11-14 DIAGNOSIS — N3941 Urge incontinence: Secondary | ICD-10-CM | POA: Diagnosis present

## 2018-11-14 DIAGNOSIS — Z882 Allergy status to sulfonamides status: Secondary | ICD-10-CM | POA: Diagnosis not present

## 2018-11-14 DIAGNOSIS — E119 Type 2 diabetes mellitus without complications: Secondary | ICD-10-CM

## 2018-11-14 DIAGNOSIS — Z20828 Contact with and (suspected) exposure to other viral communicable diseases: Secondary | ICD-10-CM | POA: Diagnosis present

## 2018-11-14 DIAGNOSIS — E871 Hypo-osmolality and hyponatremia: Secondary | ICD-10-CM | POA: Diagnosis present

## 2018-11-14 DIAGNOSIS — Z881 Allergy status to other antibiotic agents status: Secondary | ICD-10-CM | POA: Diagnosis not present

## 2018-11-14 DIAGNOSIS — I160 Hypertensive urgency: Secondary | ICD-10-CM | POA: Diagnosis present

## 2018-11-14 DIAGNOSIS — R5381 Other malaise: Secondary | ICD-10-CM

## 2018-11-14 DIAGNOSIS — N3289 Other specified disorders of bladder: Secondary | ICD-10-CM | POA: Diagnosis present

## 2018-11-14 DIAGNOSIS — E860 Dehydration: Secondary | ICD-10-CM | POA: Diagnosis present

## 2018-11-14 DIAGNOSIS — R110 Nausea: Secondary | ICD-10-CM | POA: Diagnosis present

## 2018-11-14 DIAGNOSIS — N390 Urinary tract infection, site not specified: Secondary | ICD-10-CM | POA: Diagnosis present

## 2018-11-14 DIAGNOSIS — Z7984 Long term (current) use of oral hypoglycemic drugs: Secondary | ICD-10-CM | POA: Diagnosis not present

## 2018-11-14 DIAGNOSIS — I10 Essential (primary) hypertension: Secondary | ICD-10-CM | POA: Diagnosis present

## 2018-11-14 DIAGNOSIS — Z7982 Long term (current) use of aspirin: Secondary | ICD-10-CM | POA: Diagnosis not present

## 2018-11-14 DIAGNOSIS — Z79899 Other long term (current) drug therapy: Secondary | ICD-10-CM | POA: Diagnosis not present

## 2018-11-14 DIAGNOSIS — F17200 Nicotine dependence, unspecified, uncomplicated: Secondary | ICD-10-CM | POA: Diagnosis present

## 2018-11-14 LAB — BASIC METABOLIC PANEL
Anion gap: 11 (ref 5–15)
Anion gap: 5 (ref 5–15)
BUN: 16 mg/dL (ref 8–23)
BUN: 19 mg/dL (ref 8–23)
CO2: 22 mmol/L (ref 22–32)
CO2: 22 mmol/L (ref 22–32)
Calcium: 7.8 mg/dL — ABNORMAL LOW (ref 8.9–10.3)
Calcium: 8 mg/dL — ABNORMAL LOW (ref 8.9–10.3)
Chloride: 100 mmol/L (ref 98–111)
Chloride: 94 mmol/L — ABNORMAL LOW (ref 98–111)
Creatinine, Ser: 1.01 mg/dL — ABNORMAL HIGH (ref 0.44–1.00)
Creatinine, Ser: 1.06 mg/dL — ABNORMAL HIGH (ref 0.44–1.00)
GFR calc Af Amer: 55 mL/min — ABNORMAL LOW (ref >60)
GFR calc Af Amer: 58 mL/min — ABNORMAL LOW (ref >60)
GFR calc non Af Amer: 47 mL/min — ABNORMAL LOW (ref >60)
GFR calc non Af Amer: 50 mL/min — ABNORMAL LOW (ref >60)
Glucose, Bld: 112 mg/dL — ABNORMAL HIGH (ref 70–99)
Glucose, Bld: 118 mg/dL — ABNORMAL HIGH (ref 70–99)
Potassium: 3.6 mmol/L (ref 3.5–5.1)
Potassium: 3.9 mmol/L (ref 3.5–5.1)
Sodium: 127 mmol/L — ABNORMAL LOW (ref 135–145)
Sodium: 127 mmol/L — ABNORMAL LOW (ref 135–145)

## 2018-11-14 LAB — GLUCOSE, CAPILLARY
Glucose-Capillary: 124 mg/dL — ABNORMAL HIGH (ref 70–99)
Glucose-Capillary: 120 mg/dL — ABNORMAL HIGH (ref 70–99)
Glucose-Capillary: 146 mg/dL — ABNORMAL HIGH (ref 70–99)
Glucose-Capillary: 110 mg/dL — ABNORMAL HIGH (ref 70–99)
Glucose-Capillary: 144 mg/dL — ABNORMAL HIGH (ref 70–99)

## 2018-11-14 LAB — CBC
HCT: 36.2 % (ref 36.0–46.0)
Hemoglobin: 12.3 g/dL (ref 12.0–15.0)
MCH: 31.3 pg (ref 26.0–34.0)
MCHC: 34 g/dL (ref 30.0–36.0)
MCV: 92.1 fL (ref 80.0–100.0)
Platelets: 315 10*3/uL (ref 150–400)
RBC: 3.93 MIL/uL (ref 3.87–5.11)
RDW: 12.2 % (ref 11.5–15.5)
WBC: 8.2 10*3/uL (ref 4.0–10.5)
nRBC: 0 % (ref 0.0–0.2)

## 2018-11-14 LAB — HEMOGLOBIN A1C
Hgb A1c MFr Bld: 5.7 % — ABNORMAL HIGH (ref 4.8–5.6)
Mean Plasma Glucose: 116.89 mg/dL

## 2018-11-14 LAB — OSMOLALITY: Osmolality: 264 mOsm/kg — ABNORMAL LOW (ref 275–295)

## 2018-11-14 LAB — SARS CORONAVIRUS 2 (TAT 6-24 HRS): SARS Coronavirus 2: NEGATIVE

## 2018-11-14 MED ORDER — HYDRALAZINE HCL 20 MG/ML IJ SOLN
10.0000 mg | Freq: Once | INTRAMUSCULAR | Status: AC
  Administered 2018-11-14: 10 mg via INTRAVENOUS
  Filled 2018-11-14: qty 1

## 2018-11-14 MED ORDER — LISINOPRIL 20 MG PO TABS
20.0000 mg | ORAL_TABLET | Freq: Every day | ORAL | Status: DC
  Administered 2018-11-14: 12:00:00 20 mg via ORAL
  Filled 2018-11-14: qty 1

## 2018-11-14 MED ORDER — HYDROCHLOROTHIAZIDE 12.5 MG PO CAPS
12.5000 mg | ORAL_CAPSULE | Freq: Every day | ORAL | Status: DC
  Administered 2018-11-14: 12.5 mg via ORAL
  Filled 2018-11-14: qty 1

## 2018-11-14 MED ORDER — LISINOPRIL 40 MG PO TABS
40.0000 mg | ORAL_TABLET | Freq: Every day | ORAL | Status: DC
  Administered 2018-11-15 – 2018-11-18 (×4): 40 mg via ORAL
  Filled 2018-11-14 (×4): qty 1

## 2018-11-14 MED ORDER — TRAMADOL HCL 50 MG PO TABS: 50.0000 mg | ORAL_TABLET | Freq: Two times a day (BID) | ORAL | Status: DC | PRN

## 2018-11-14 MED ORDER — SODIUM CHLORIDE 0.9 % IV SOLN
INTRAVENOUS | Status: DC | PRN
  Administered 2018-11-14 (×2): 250 mL via INTRAVENOUS

## 2018-11-14 MED ORDER — LISINOPRIL-HYDROCHLOROTHIAZIDE 20-12.5 MG PO TABS: 1.0000 | ORAL_TABLET | Freq: Every day | ORAL | Status: DC

## 2018-11-14 MED ORDER — ASPIRIN EC 81 MG PO TBEC
81.0000 mg | DELAYED_RELEASE_TABLET | Freq: Every day | ORAL | Status: DC
  Administered 2018-11-14 – 2018-11-17 (×4): 81 mg via ORAL
  Filled 2018-11-14 (×4): qty 1

## 2018-11-14 MED ORDER — FAMOTIDINE 20 MG PO TABS
20.0000 mg | ORAL_TABLET | Freq: Two times a day (BID) | ORAL | Status: DC
  Administered 2018-11-14 – 2018-11-18 (×9): 20 mg via ORAL
  Filled 2018-11-14 (×9): qty 1

## 2018-11-14 MED ORDER — LISINOPRIL 20 MG PO TABS
20.0000 mg | ORAL_TABLET | Freq: Once | ORAL | Status: AC
  Administered 2018-11-14: 20 mg via ORAL
  Filled 2018-11-14: qty 1

## 2018-11-14 NOTE — Progress Notes (Signed)
Progress Note    Cheryl Graves  UVO:[id number] DOB: 12-25-1928  DOA: 11/13/2018 PCP: [facility]    Brief Narrative:     Medical records reviewed and are as summarized below:  Cheryl Graves is an 85 y.o. female with medical history significant of type 2 diabetes, hypertension presenting to the ED for evaluation of generalized weakness.  History provided by patient and her niece at bedside.  Patient was initially diagnosed with a UTI about 2 weeks ago and treated with an antibiotic.  She continued to be symptomatic so then another antibiotic (Macrobid) was prescribed.  Patient continues to feel tired, lightheaded, and has no appetite.  She has been feeling nauseous but has not vomited.    Assessment/Plan:   Principal Problem:   UTI (urinary tract infection) Active Problems:   Hyponatremia   Type 2 diabetes mellitus ([place])   Physical deconditioning   Hypertensive urgency   UTI Patient failed recent outpatient antibiotic treatment for UTI.  Afebrile.  White count mildly elevated at 11.1. UA with positive nitrite, large amount of leukocytes, and greater than 50 WBCs.  -Ceftriaxone -Urine culture- pending -Continue to monitor white count  Hypertensive urgency Blood pressure significantly elevated with systolic up to 742.  Patient is currently taking lisinopril 20 mg and hydrochlorothiazide 12.5 mg at home.  Asymptomatic.  No complaints of headache, chest pain, shortness of breath, or abdominal pain.  No evidence of AKI. -IV hydralazine PRN SBP >160 -resume lisinopril but hold HCTZ -increase lisinopril  Acute on chronic hyponatremia Corrected sodium 127.  Sodium 129-131 on labs done in 2018.  No recent labs for comparison.  Hyponatremia likely related to decreased p.o. intake and home hydrochlorothiazide diuretic use.  Appears dry on exam. -Gentle IV fluid hydration  Non-insulin-dependent diabetes mellitus -SSI  Physical deconditioning  Likely related to UTI and dehydration. -PT evaluation    Family Communication/Anticipated D/C date and plan/Code Status   DVT prophylaxis: Lovenox ordered. Code Status: Full Code.  Family Communication:  Disposition Plan:  Continue to need IVF and IV abx as her PO intake remains poor-- hope once UTI treated, her intake will improve (eating <50%)-- will liberlize her diet Will change to inpatient as she failed outpatient abx and needs continued hospital level care   Medical Consultants:    None.     Subjective:   Not feeling well  Objective:    Vitals:   11/14/18 0107 11/14/18 0309 11/14/18 0637 11/14/18 1021  BP: (!) 171/106 (!) 203/66 (!) 150/66 (!) 188/76  Pulse: 80 62 82 74  Resp: 20  18   Temp: 98.2 F (36.8 C)  97.7 F (36.5 C)   TempSrc: Oral  Oral   SpO2: 100%  97%   Weight: 68.9 kg     Height: 5\' 4"  (1.626 m)       Intake/Output Summary (Last 24 hours) at 11/14/2018 1127 Last data filed at 11/14/2018 1000 Gross per 24 hour  Intake 2385.73 ml  Output 1225 ml  Net 1160.73 ml   Filed Weights   11/14/18 0107  Weight: 68.9 kg    Exam: In bed, appears ill Flushed rrr No increased work of breathing Pleasant and cooperative  Data Reviewed:   I have personally reviewed following labs and imaging studies:  Labs: Labs show the following:   Basic Metabolic Panel: Recent Labs  Lab 11/13/18 2132 11/14/18 0028 11/14/18 0544  NA 126* 127* 127*  K 4.0 3.9 3.6  CL 89*  94* 100  CO2 26 22 22   GLUCOSE 144* 118* 112*  BUN 20 19 16   CREATININE 1.18* 1.06* 1.01*  CALCIUM 8.6* 8.0* 7.8*   GFR Estimated Creatinine Clearance: 37.4 mL/min (A) (by C-G formula based on SCr of 1.01 mg/dL (H)). Liver Function Tests: No results for input(s): AST, ALT, ALKPHOS, BILITOT, PROT, ALBUMIN in the last 168 hours. No results for input(s): LIPASE, AMYLASE in the last 168 hours. No results for input(s): AMMONIA in the last 168 hours. Coagulation profile No results  for input(s): INR, PROTIME in the last 168 hours.  CBC: Recent Labs  Lab 11/13/18 2132 11/14/18 0544  WBC 11.1* 8.2  HGB 13.2 12.3  HCT 38.5 36.2  MCV 92.5 92.1  PLT 351 315   Cardiac Enzymes: No results for input(s): CKTOTAL, CKMB, CKMBINDEX, TROPONINI in the last 168 hours. BNP (last 3 results) No results for input(s): PROBNP in the last 8760 hours. CBG: Recent Labs  Lab 11/14/18 0116 11/14/18 0549  GLUCAP 124* 120*   D-Dimer: No results for input(s): DDIMER in the last 72 hours. Hgb A1c: Recent Labs    11/14/18 0028  HGBA1C 5.7*   Lipid Profile: No results for input(s): CHOL, HDL, LDLCALC, TRIG, CHOLHDL, LDLDIRECT in the last 72 hours. Thyroid function studies: No results for input(s): TSH, T4TOTAL, T3FREE, THYROIDAB in the last 72 hours.  Invalid input(s): FREET3 Anemia work up: No results for input(s): VITAMINB12, FOLATE, FERRITIN, TIBC, IRON, RETICCTPCT in the last 72 hours. Sepsis Labs: Recent Labs  Lab 11/13/18 2132 11/14/18 0544  WBC 11.1* 8.2    Microbiology Recent Results (from the past 240 hour(s))  SARS CORONAVIRUS 2 (TAT 6-24 HRS) Nasopharyngeal Nasopharyngeal Swab     Status: None   Collection Time: 11/14/18 12:17 AM   Specimen: Nasopharyngeal Swab  Result Value Ref Range Status   SARS Coronavirus 2 NEGATIVE NEGATIVE Final    Comment: (NOTE) SARS-CoV-2 target nucleic acids are NOT DETECTED. The SARS-CoV-2 RNA is generally detectable in upper and lower respiratory specimens during the acute phase of infection. Negative results do not preclude SARS-CoV-2 infection, do not rule out co-infections with other pathogens, and should not be used as the sole basis for treatment or other patient management decisions. Negative results must be combined with clinical observations, patient history, and epidemiological information. The expected result is Negative. Fact Sheet for Patients: 11/16/18 Fact Sheet for  Healthcare Providers: 11/16/18 This test is not yet approved or cleared by the [place] FDA and  has been authorized for detection and/or diagnosis of SARS-CoV-2 by FDA under an Emergency Use Authorization (EUA). This EUA will remain  in effect (meaning this test can be used) for the duration of the COVID-19 declaration under Section 56 4(b)(1) of the Act, 21 U.S.C. section 360bbb-3(b)(1), unless the authorization is terminated or revoked sooner. Performed at [facility], [address], [place], [address] [place]     Procedures and diagnostic studies:  No results found.  Medications:   . aspirin EC  81 mg Oral QHS  . enoxaparin (LOVENOX) injection  40 mg Subcutaneous Q24H  . famotidine  20 mg Oral BID  . lisinopril  20 mg Oral Daily   And  . hydrochlorothiazide  12.5 mg Oral Daily  . insulin aspart  0-5 Units Subcutaneous QHS  . insulin aspart  0-9 Units Subcutaneous TID WC   Continuous Infusions: . sodium chloride 100 mL/hr at 11/14/18 0120  . cefTRIAXone (ROCEPHIN)  IV Stopped (11/14/18 0101)  LOS: 0 days   Joseph ArtJessica U Cutter Passey  [facility]   How to contact the [facility] Attending or Consulting provider 7A - 7P or covering provider during after hours 7P -7A, for this patient?  1. Check the care team in [facility] and look for a) attending/consulting TR[facility] provider listed and b) the [facility] team listed 2. Log into [email or web address] and use [facility] universal password to access. If you do not have the password, please contact the hospital operator. 3. Locate the [facility] provider you are looking for under [facility] and page to a number that you can be directly reached. 4. If you still have difficulty reaching the provider, please page the [facility] (Director on Call) for the Hospitalists listed on amion for assistance.  11/14/2018, 11:27 AM

## 2018-11-14 NOTE — Evaluation (Signed)
Physical Therapy Evaluation Patient Details Name: Cheryl Graves MRN: [id number] DOB: 02/26/1928 Today's Date: 11/14/2018   History of Present Illness    85 y.o. female with medical history significant oftype 2 diabetes, hypertension presenting to the ED for evaluation of generalized weakness.History provided by patient and her niece at bedside. Patient was initially diagnosed with a UTI about 2 weeks ago and treated with an antibiotic. She continued to be symptomatic so then another antibiotic (Macrobid) was prescribed. Patient continues to feel tired, lightheaded, and has no appetite. She has been feeling nauseous but has not vomited.    Clinical Impression  Pt presents with mild to moderate limitations to function due to premorbid OA pain in R knee along with weakness and cardiopulmonary dysfunction limiting gait, activity tolerance and basic mobility tasks.  Limited today by elevated BP and knee pain.  Expect as she medically improves she will physically improve, and recommend up daily with nursing for meals, bathroom, and short walks in the hallway.  Pt indicated plan to stay with ?friend June at d/c and niece provides much supportive care currently.  Will defer mobility needs to nursing team and recommend HHPT at d/c with shortterm 24 hour assist at d/c (as function at d/c may dictate).  No other PT needs at this time, but if function should not improve as expected, please reconsult.    Follow Up Recommendations Home health PT;Supervision/Assistance - 24 hour(initally 24 hour supervision/assist only)    Equipment Recommendations  Rolling walker with 5" wheels;3in1 (PT)(pt uses cane, unsure if has RW)    Recommendations for Other Services       Precautions / Restrictions Precautions Precautions: Fall      Mobility  Bed Mobility Overal bed mobility: Modified Independent             General bed mobility comments: incr time/effort  Transfers Overall transfer level:  Needs assistance Equipment used: Rolling walker (2 wheeled) Transfers: Sit to/from Stand Sit to Stand: Mod assist         General transfer comment: requires cues for optimal hand placement in setting of knee OA and sway instability upon standing pt reports as "see that's what happens"; better 2nd trial with RW positioned, and educated on slow transitions for tranisent orthostasis  Ambulation/Gait Ambulation/Gait assistance: Min assist Gait Distance (Feet): 20 Feet Assistive device: Rolling walker (2 wheeled) Gait Pattern/deviations: Step-through pattern;Antalgic;Trunk flexed;Decreased step length - right Gait velocity: slow, unable to measure in room   General Gait Details: guarded gait due to R knee pain, instructed on sequencing to optimize benefit of RW to offload painful knee.  Pt deferred out of room  Stairs            Wheelchair Mobility    Modified Rankin (Stroke Patients Only)       Balance Overall balance assessment: Needs assistance Sitting-balance support: Feet supported;No upper extremity supported Sitting balance-Leahy Scale: Good     Standing balance support: During functional activity;Bilateral upper extremity supported Standing balance-Leahy Scale: Poor Standing balance comment: depends on device for stability                             Pertinent Vitals/Pain Pain Assessment: 0-10 Pain Score: 6  Pain Location: right knee Pain Descriptors / Indicators: Aching;Grimacing;Guarding Pain Intervention(s): Limited activity within patient's tolerance;Monitored during session;Repositioned    Home Living Family/patient expects to be discharged to:: Private residence Living Arrangements: Alone Available Help at Discharge: Family;Friend(s);Available  24 hours/day Type of Home: House           Additional Comments: unable to confirm home environment as pt may stay with friend June after d/c, please investigate    Prior Function Level of  Independence: Independent with assistive device(s)         Comments: neice drives her to md appts, grocery shops, housekeeping, and meals per pt     Hand Dominance        Extremity/Trunk Assessment   Upper Extremity Assessment Upper Extremity Assessment: Generalized weakness    Lower Extremity Assessment Lower Extremity Assessment: Generalized weakness;RLE deficits/detail RLE Deficits / Details: premorbid OA R knee, planned surgery Mar 2020 but cancelled due to [place], pt encouraged to call surgeon about rescheduling(lacks <5 degree R knee extension)       Communication   Communication: HOH  Cognition Arousal/Alertness: Awake/alert Behavior During Therapy: WFL for tasks assessed/performed Overall Cognitive Status: Within Functional Limits for tasks assessed                                 General Comments: reluctant to get up "tired" but does cooperate appropriately      General Comments General comments (skin integrity, edema, etc.): pt's BP elevated systolically, RN ok'd getting up, activity limited to tolerance    Exercises Other Exercises Other Exercises: seated LAQ w/AP 5 reps at a time alternate Other Exercises: supine QS w/AP 5-10 reps alternating sides   Assessment/Plan    PT Assessment All further PT needs can be met in the next venue of care  PT Problem List Decreased strength;Decreased range of motion;Decreased activity tolerance;Decreased balance;Decreased mobility;Decreased knowledge of use of DME;Cardiopulmonary status limiting activity;Pain       PT Treatment Interventions      PT Goals (Current goals can be found in the Care Plan section)  Acute Rehab PT Goals Patient Stated Goal: feel better, go home PT Goal Formulation: All assessment and education complete, DC therapy    Frequency     Barriers to discharge        Co-evaluation               AM-PAC PT "6 Clicks" Mobility  Outcome Measure Help needed turning from  your back to your side while in a flat bed without using bedrails?: None Help needed moving from lying on your back to sitting on the side of a flat bed without using bedrails?: A Little Help needed moving to and from a bed to a chair (including a wheelchair)?: A Little Help needed standing up from a chair using your arms (e.g., wheelchair or bedside chair)?: A Lot Help needed to walk in hospital room?: A Little Help needed climbing 3-5 steps with a railing? : A Lot 6 Click Score: 17    End of Session Equipment Utilized During Treatment: Gait belt Activity Tolerance: Patient tolerated treatment well;Patient limited by fatigue Patient left: in bed;with call bell/phone within reach;with bed alarm set Nurse Communication: Mobility status PT Visit Diagnosis: Difficulty in walking, not elsewhere classified (R26.2);Muscle weakness (generalized) (M62.81);Pain Pain - Right/Left: Right Pain - part of body: Knee    Time: 4580-9983 PT Time Calculation (min) (ACUTE ONLY): 45 min   Charges:   PT Evaluation $PT Eval Low Complexity: 1 Low PT Treatments $Gait Training: 8-22 mins $Therapeutic Exercise: 8-22 mins $Therapeutic Activity: 8-22 mins        Kearney Hard, PT, DPT, MS  Board Certified Geriatric Clinical Specialist  Herbie Drape 11/14/2018, 2:26 PM

## 2018-11-14 NOTE — ED Notes (Signed)
ED TO INPATIENT HANDOFF REPORT  ED Nurse Name and Phone #:  [phone]  S Name/Age/Gender Cheryl FeilLinda F Graves 85 y.o. female Room/Bed: 029C/029C  Code Status   Code Status: Full Code  Home/SNF/Other Home Patient oriented to: self, place, time and situation Is this baseline? Yes   Triage Complete: Triage complete  Chief Complaint General weakness  Triage Note [facility] with generalized weakness x1 week, hx of UTI's having urinary frequency for the last few days. Usually uses a walker to get around but today is requiring more assistance    Allergies Allergies  Allergen Reactions  . Ciprofloxacin Diarrhea and Nausea Only  . Metronidazole Diarrhea and Nausea Only  . Septra [Sulfamethoxazole-Trimethoprim] Diarrhea    Level of Care/Admitting Diagnosis ED Disposition    ED Disposition Condition Comment   Admit  [facility] [[id number]]  Level of Care: Telemetry Medical [104]  I expect the patient will be discharged within 24 hours: Yes  LOW acuity---Tx typically complete <24 hrs---ACUTE conditions typically can be evaluated <24 hours---LABS likely to return to acceptable levels <24 hours---IS near functional baseline---EXPECTED to return to current living arrangement---NOT newly hypoxic: Meets criteria for 5C-Observation unit  Covid Evaluation: Asymptomatic Screening Protocol (No Symptoms)  Diagnosis: UTI (urinary tract infection) [010272][[id number]]  Admitting Physician: John GiovanniATHORE, VASUNDHRA [[id number]]  Attending Physician: John GiovanniATHORE, VASUNDHRA [[id number]]  PT Class (Do Not Modify): Observation [104]  PT Acc Code (Do Not Modify): Observation [10022]       B Medical/Surgery History Past Medical History:  Diagnosis Date  . Diabetes mellitus without complication (HCC)   . Diverticulitis   . Hypertension    Past Surgical History:  Procedure Laterality Date  . APPENDECTOMY       A IV Location/Drains/Wounds Patient Lines/Drains/Airways  Status   Active Line/Drains/Airways    Name:   Placement date:   Placement time:   Site:   Days:   Peripheral IV 11/13/18 Anterior;Left Hand   11/13/18    2048    Hand   1          Intake/Output Last 24 hours No intake or output data in the 24 hours ending 11/14/18 0012  Labs/Imaging Results for orders placed or performed during the hospital encounter of 11/13/18 (from the past 48 hour(s))  Basic metabolic panel     Status: Abnormal   Collection Time: 11/13/18  9:32 PM  Result Value Ref Range   Sodium 126 (L) 135 - 145 mmol/L   Potassium 4.0 3.5 - 5.1 mmol/L   Chloride 89 (L) 98 - 111 mmol/L   CO2 26 22 - 32 mmol/L   Glucose, Bld 144 (H) 70 - 99 mg/dL   BUN 20 8 - 23 mg/dL   Creatinine, Ser 5.631.18 (H) 0.44 - 1.00 mg/dL   Calcium 8.6 (L) 8.9 - 10.3 mg/dL   GFR calc non Af Amer 41 (L) >60 mL/min   GFR calc Af Amer 48 (L) >60 mL/min   Anion gap 11 5 - 15    Comment: Performed at [facility], [address] [place], [place] [id number]  CBC     Status: Abnormal   Collection Time: 11/13/18  9:32 PM  Result Value Ref Range   WBC 11.1 (H) 4.0 - 10.5 K/uL   RBC 4.16 3.87 - 5.11 MIL/uL   Hemoglobin 13.2 12.0 - 15.0 g/dL   HCT 33.238.5 95.136.0 - 88.446.0 %   MCV 92.5 80.0 - 100.0 fL  MCH 31.7 26.0 - 34.0 pg   MCHC 34.3 30.0 - 36.0 g/dL   RDW 12.2 11.5 - 15.5 %   Platelets 351 150 - 400 K/uL   nRBC 0.0 0.0 - 0.2 %    Comment: Performed at [facility], [address], [place], [place] [address]  Urinalysis, Routine w reflex microscopic     Status: Abnormal   Collection Time: 11/13/18 10:00 PM  Result Value Ref Range   Color, Urine YELLOW YELLOW   APPearance CLOUDY (A) CLEAR   Specific Gravity, Urine 1.009 1.005 - 1.030   pH 6.0 5.0 - 8.0   Glucose, UA NEGATIVE NEGATIVE mg/dL   Hgb urine dipstick SMALL (A) NEGATIVE   Bilirubin Urine NEGATIVE NEGATIVE   Ketones, ur NEGATIVE NEGATIVE mg/dL   Protein, ur NEGATIVE NEGATIVE mg/dL   Nitrite POSITIVE (A) NEGATIVE   Leukocytes,Ua  LARGE (A) NEGATIVE   RBC / HPF 6-10 0 - 5 RBC/hpf   WBC, UA >50 (H) 0 - 5 WBC/hpf   Bacteria, UA RARE (A) NONE SEEN   Squamous Epithelial / LPF 0-5 0 - 5   Amorphous Crystal PRESENT     Comment: Performed at [facility], [address], [place], [place] [address]   No results found.  Pending Labs Unresulted Labs (From admission, onward)    Start     Ordered   11/14/18 0500  CBC  Tomorrow morning,   R     11/13/18 2357   11/14/18 6387  Basic metabolic panel  Now then every 6 hours,   R (with STAT occurrences)     11/13/18 2357   11/13/18 2355  Hemoglobin A1c  Once,   STAT    Comments: To assess prior glycemic control    11/13/18 2357   11/13/18 2355  Urine Culture  Add-on,   AD     11/13/18 2357   11/13/18 2355  Osmolality  Once,   STAT     11/13/18 2357   11/13/18 2351  SARS CORONAVIRUS 2 (TAT 6-24 HRS) Nasopharyngeal Nasopharyngeal Swab  (Asymptomatic/Tier 2 Patients Labs)  Once,   STAT    Question Answer Comment  Is this test for diagnosis or screening Screening   Symptomatic for COVID-19 as defined by CDC No   Hospitalized for COVID-19 No   Admitted to ICU for COVID-19 No   Previously tested for COVID-19 No   Resident in a congregate (group) care setting Unknown   Employed in healthcare setting Unknown   Pregnant No      11/13/18 2350          Vitals/Pain Today's Vitals   11/13/18 2230 11/13/18 2300 11/13/18 2322 11/13/18 2329  BP: (!) 165/64 (!) 193/71 (!) 198/66   Pulse: (!) 45 65 60   Resp: 20 14    Temp:      TempSrc:      SpO2: 94% 97% 98%   PainSc:    0-No pain    Isolation Precautions No active isolations  Medications Medications  enoxaparin (LOVENOX) injection 40 mg (has no administration in time range)  acetaminophen (TYLENOL) tablet 650 mg (has no administration in time range)    Or  acetaminophen (TYLENOL) suppository 650 mg (has no administration in time range)  insulin aspart (novoLOG) injection 0-9 Units (has no administration in  time range)  insulin aspart (novoLOG) injection 0-5 Units (has no administration in time range)  cefTRIAXone (ROCEPHIN) 1 g in sodium chloride 0.9 % 100 mL  IVPB (has no administration in time range)  hydrALAZINE (APRESOLINE) injection 5 mg (5 mg Intravenous Given 11/14/18 0011)  0.9 %  sodium chloride infusion (has no administration in time range)  sodium chloride flush (NS) 0.9 % injection 3 mL (3 mLs Intravenous Given 11/13/18 2326)  sodium chloride 0.9 % bolus 1,000 mL (1,000 mLs Intravenous New Bag/Given 11/13/18 2326)    Mobility walks with device     Focused Assessments    R Recommendations: See Admitting Provider Note  Report given to:   Additional Notes:

## 2018-11-14 NOTE — Plan of Care (Signed)
  Problem: Pain Managment: Goal: General experience of comfort will improve Outcome: Completed/Met   Problem: Pain Managment: Goal: General experience of comfort will improve Outcome: Completed/Met

## 2018-11-14 NOTE — Progress Notes (Signed)
Pt asking about home meds, paged MD , bp 188/76

## 2018-11-15 LAB — BASIC METABOLIC PANEL
Anion gap: 13 (ref 5–15)
BUN: 12 mg/dL (ref 8–23)
CO2: 21 mmol/L — ABNORMAL LOW (ref 22–32)
Calcium: 8.3 mg/dL — ABNORMAL LOW (ref 8.9–10.3)
Chloride: 93 mmol/L — ABNORMAL LOW (ref 98–111)
Creatinine, Ser: 1.02 mg/dL — ABNORMAL HIGH (ref 0.44–1.00)
GFR calc Af Amer: 57 mL/min — ABNORMAL LOW (ref >60)
GFR calc non Af Amer: 49 mL/min — ABNORMAL LOW (ref >60)
Glucose, Bld: 139 mg/dL — ABNORMAL HIGH (ref 70–99)
Potassium: 3.5 mmol/L (ref 3.5–5.1)
Sodium: 127 mmol/L — ABNORMAL LOW (ref 135–145)

## 2018-11-15 LAB — CBC
HCT: 34.7 % — ABNORMAL LOW (ref 36.0–46.0)
Hemoglobin: 12.1 g/dL (ref 12.0–15.0)
MCH: 31.5 pg (ref 26.0–34.0)
MCHC: 34.9 g/dL (ref 30.0–36.0)
MCV: 90.4 fL (ref 80.0–100.0)
Platelets: 312 10*3/uL (ref 150–400)
RBC: 3.84 MIL/uL — ABNORMAL LOW (ref 3.87–5.11)
RDW: 12.4 % (ref 11.5–15.5)
WBC: 7.8 10*3/uL (ref 4.0–10.5)
nRBC: 0 % (ref 0.0–0.2)

## 2018-11-15 LAB — URINE CULTURE
Culture: NO GROWTH
Special Requests: NORMAL

## 2018-11-15 LAB — CALCIUM, IONIZED: Calcium, Ionized, Serum: 4.7 mg/dL (ref 4.5–5.6)

## 2018-11-15 LAB — GLUCOSE, CAPILLARY
Glucose-Capillary: 130 mg/dL — ABNORMAL HIGH (ref 70–99)
Glucose-Capillary: 152 mg/dL — ABNORMAL HIGH (ref 70–99)
Glucose-Capillary: 120 mg/dL — ABNORMAL HIGH (ref 70–99)

## 2018-11-15 MED ORDER — CEPHALEXIN 250 MG PO CAPS
250.0000 mg | ORAL_CAPSULE | Freq: Four times a day (QID) | ORAL | Status: DC
  Administered 2018-11-16 – 2018-11-18 (×9): 250 mg via ORAL
  Filled 2018-11-15 (×9): qty 1

## 2018-11-15 MED ORDER — LABETALOL HCL 5 MG/ML IV SOLN
10.0000 mg | Freq: Once | INTRAVENOUS | Status: AC
  Administered 2018-11-15: 10 mg via INTRAVENOUS
  Filled 2018-11-15: qty 4

## 2018-11-15 MED ORDER — HYDRALAZINE HCL 50 MG PO TABS
50.0000 mg | ORAL_TABLET | Freq: Three times a day (TID) | ORAL | Status: DC
  Administered 2018-11-15 – 2018-11-18 (×8): 50 mg via ORAL
  Filled 2018-11-15 (×8): qty 1

## 2018-11-15 MED ORDER — HYDRALAZINE HCL 25 MG PO TABS
25.0000 mg | ORAL_TABLET | Freq: Three times a day (TID) | ORAL | Status: DC
  Administered 2018-11-15: 25 mg via ORAL
  Filled 2018-11-15: qty 1

## 2018-11-15 NOTE — Progress Notes (Signed)
[facility]  PROGRESS NOTE    Cheryl Graves  NWG:[id number] DOB: August 21, 1928 DOA: 11/13/2018 PCP: [facility]   Brief Narrative:   Cheryl Graves is an 85 y.o. female with medical history significant oftype 2 diabetes, hypertension presenting to the ED for evaluation of generalized weakness.History provided by patient and her niece at bedside. Patient was initially diagnosed with a UTI about 2 weeks ago and treated with an antibiotic. She continued to be symptomatic so then another antibiotic (Macrobid) was prescribed. Patient continues to feel tired, lightheaded, and has no appetite. She has been feeling nauseous but has not vomited.   9/28: Reports trying to eat but still with poor appetite; nausea after meal.   Assessment & Plan:   Principal Problem:   UTI (urinary tract infection) Active Problems:   Hyponatremia   Type 2 diabetes mellitus (HCC)   Physical deconditioning   Hypertensive urgency   UTI     - afebrile, WBC ok     - continue rocephin for now; transition to keflex to complete abx tomorrow  Hyponatremia     - baseline 129-131? She's 127 right now w/o symptoms     - monitor for now  DM2     - carb-modified diet     - SSI, monitor sugars  Physical deconditioning     - PT recs HHPT; 24h assist/supervisiotn     - RW; 3-n-1     - consult case management  Hypertensive Urgency     - lisinopril 40mg  qday     - add scheduled hydralazine     - can add amlodipine next  Nausea     - PRN zofran     - pepcid     - fluids  DVT prophylaxis: lovenox Code Status: FULL   Disposition Plan: TBD  Antimicrobials:  . rocephin   ROS:  Reports N, poor appetite. Denies CP, HA, V, D. Remainder 10-pt ROS is negative for all not previously mentioned.  Subjective: "I get sick after eating."  Objective: Vitals:   11/15/18 0728 11/15/18 0845 11/15/18 0918 11/15/18 1210  BP: (!) 156/59 (!) 138/49 (!) 179/57 (!) 187/64  Pulse: 60 66 66 65   Resp:  18 18 20   Temp:  97.8 F (36.6 C) 98.3 F (36.8 C) 98.2 F (36.8 C)  TempSrc:  Oral Oral Oral  SpO2:   100% 99%  Weight:      Height:        Intake/Output Summary (Last 24 hours) at 11/15/2018 1358 Last data filed at 11/15/2018 5784 Gross per 24 hour  Intake 858.08 ml  Output 1175 ml  Net -316.92 ml   Filed Weights   11/14/18 0107 11/15/18 0420  Weight: 68.9 kg 68.2 kg    Examination:  General: 85 y.o. female resting in bed in NAD Eyes: PERRL, normal sclera ENMT: Nares patent w/o discharge, orophaynx clear, dentition normal, ears w/o discharge/lesions/ulcers Cardiovascular: RRR, +S1, S2, no m/g/r, equal pulses throughout Respiratory: CTABL, no w/r/r, normal WOB GI: BS+, NDNT, no masses noted, no organomegaly noted MSK: No e/c/c Skin: No rashes, bruises, ulcerations noted Neuro: A&O x 3, no focal deficits Psyc: Appropriate interaction and affect, calm/cooperative   Data Reviewed: I have personally reviewed following labs and imaging studies.  CBC: Recent Labs  Lab 11/13/18 2132 11/14/18 0544 11/15/18 0718  WBC 11.1* 8.2 7.8  HGB 13.2 12.3 12.1  HCT 38.5 36.2 34.7*  MCV 92.5 92.1 90.4  PLT 351 315 312  Basic Metabolic Panel: Recent Labs  Lab 11/13/18 2132 11/14/18 0028 11/14/18 0544 11/15/18 0718  NA 126* 127* 127* 127*  K 4.0 3.9 3.6 3.5  CL 89* 94* 100 93*  CO2 26 22 22  21*  GLUCOSE 144* 118* 112* 139*  BUN 20 19 16 12   CREATININE 1.18* 1.06* 1.01* 1.02*  CALCIUM 8.6* 8.0* 7.8* 8.3*   GFR: Estimated Creatinine Clearance: 36.9 mL/min (A) (by C-G formula based on SCr of 1.02 mg/dL (H)). Liver Function Tests: No results for input(s): AST, ALT, ALKPHOS, BILITOT, PROT, ALBUMIN in the last 168 hours. No results for input(s): LIPASE, AMYLASE in the last 168 hours. No results for input(s): AMMONIA in the last 168 hours. Coagulation Profile: No results for input(s): INR, PROTIME in the last 168 hours. Cardiac Enzymes: No results for input(s):  CKTOTAL, CKMB, CKMBINDEX, TROPONINI in the last 168 hours. BNP (last 3 results) No results for input(s): PROBNP in the last 8760 hours. HbA1C: Recent Labs    11/14/18 0028  HGBA1C 5.7*   CBG: Recent Labs  Lab 11/14/18 1205 11/14/18 1552 11/14/18 2127 11/15/18 0620 11/15/18 1208  GLUCAP 146* 110* 144* 130* 152*   Lipid Profile: No results for input(s): CHOL, HDL, LDLCALC, TRIG, CHOLHDL, LDLDIRECT in the last 72 hours. Thyroid Function Tests: No results for input(s): TSH, T4TOTAL, FREET4, T3FREE, THYROIDAB in the last 72 hours. Anemia Panel: No results for input(s): VITAMINB12, FOLATE, FERRITIN, TIBC, IRON, RETICCTPCT in the last 72 hours. Sepsis Labs: No results for input(s): PROCALCITON, LATICACIDVEN in the last 168 hours.  Recent Results (from the past 240 hour(s))  SARS CORONAVIRUS 2 (TAT 6-24 HRS) Nasopharyngeal Nasopharyngeal Swab     Status: None   Collection Time: 11/14/18 12:17 AM   Specimen: Nasopharyngeal Swab  Result Value Ref Range Status   SARS Coronavirus 2 NEGATIVE NEGATIVE Final    Comment: (NOTE) SARS-CoV-2 target nucleic acids are NOT DETECTED. The SARS-CoV-2 RNA is generally detectable in upper and lower respiratory specimens during the acute phase of infection. Negative results do not preclude SARS-CoV-2 infection, do not rule out co-infections with other pathogens, and should not be used as the sole basis for treatment or other patient management decisions. Negative results must be combined with clinical observations, patient history, and epidemiological information. The expected result is Negative. Fact Sheet for Patients: 11/17/18 Fact Sheet for Healthcare Providers: 11/16/18 This test is not yet approved or cleared by the [place] FDA and  has been authorized for detection and/or diagnosis of SARS-CoV-2 by FDA under an Emergency Use Authorization (EUA). This EUA will remain   in effect (meaning this test can be used) for the duration of the COVID-19 declaration under Section 56 4(b)(1) of the Act, 21 U.S.C. section 360bbb-3(b)(1), unless the authorization is terminated or revoked sooner. Performed at [facility], [address], [place], [address] [place]   Culture, Urine     Status: None   Collection Time: 11/14/18  3:22 AM   Specimen: Urine, Random  Result Value Ref Range Status   Specimen Description URINE, RANDOM  Final   Special Requests Normal  Final   Culture   Final    NO GROWTH Performed at [facility], [address], [place], [address] [place]    Report Status 11/15/2018 FINAL  Final      Radiology Studies: No results found.   Scheduled Meds: . aspirin EC  81 mg Oral QHS  . enoxaparin (LOVENOX) injection  40 mg Subcutaneous Q24H  .  famotidine  20 mg Oral BID  . hydrALAZINE  25 mg Oral Q8H  . insulin aspart  0-5 Units Subcutaneous QHS  . insulin aspart  0-9 Units Subcutaneous TID WC  . lisinopril  40 mg Oral Daily   Continuous Infusions: . sodium chloride Stopped (11/15/18 0033)  . cefTRIAXone (ROCEPHIN)  IV Stopped (11/15/18 0018)     LOS: 1 day    Time spent: 25 minutes spent in the coordination of care today.   Teddy Spikeyrone A Annamaria Salah, DO [facility] Pager [phone]  If 7PM-7AM, please contact night-coverage [email or web address] Password [email or web address] 11/15/2018, 1:58 PM

## 2018-11-15 NOTE — Progress Notes (Signed)
   Vital Signs MEWS/VS Documentation      11/14/2018 1250 11/14/2018 1927 11/14/2018 1930 11/15/2018 0022   MEWS Score:  0  0  0  2   MEWS Score Color:  Green  Green  Green  Yellow   Resp:  -  18  -  18   Pulse:  96  66  -  67   BP:  (!) 133/50  (!) 189/65  -  (!) 210/64   Temp:  -  98.3 F (36.8 C)  -  98.3 F (36.8 C)   O2 Device:  -  Room Air  -  Room Air           Tristan Schroeder 11/15/2018,12:45 AM

## 2018-11-15 NOTE — Plan of Care (Signed)
  Problem: Education: Goal: Knowledge of General Education information will improve Description Including pain rating scale, medication(s)/side effects and non-pharmacologic comfort measures Outcome: Progressing   Problem: Health Behavior/Discharge Planning: Goal: Ability to manage health-related needs will improve Outcome: Progressing   

## 2018-11-15 NOTE — Progress Notes (Signed)
BP elevated after PRN given x2, provider notified.

## 2018-11-15 NOTE — Progress Notes (Signed)
   Vital Signs MEWS/VS Documentation      11/15/2018 0022 11/15/2018 0112 11/15/2018 0114 11/15/2018 0420   MEWS Score:  2  0  0  2   MEWS Score Color:  Yellow  Green  Green  Yellow   Resp:  18  -  -  18   Pulse:  67  68  85  70   BP:  (!) 210/64  (!) 191/62  (!) 187/53  (!) 207/66   Temp:  98.3 F (36.8 C)  -  -  98.2 F (36.8 C)   O2 Device:  Room [employer]  -  -  Room Air           Cheryl Graves 11/15/2018,4:29 AM

## 2018-11-16 DIAGNOSIS — E119 Type 2 diabetes mellitus without complications: Secondary | ICD-10-CM

## 2018-11-16 LAB — GLUCOSE, CAPILLARY
Glucose-Capillary: 123 mg/dL — ABNORMAL HIGH (ref 70–99)
Glucose-Capillary: 163 mg/dL — ABNORMAL HIGH (ref 70–99)
Glucose-Capillary: 190 mg/dL — ABNORMAL HIGH (ref 70–99)
Glucose-Capillary: 115 mg/dL — ABNORMAL HIGH (ref 70–99)
Glucose-Capillary: 150 mg/dL — ABNORMAL HIGH (ref 70–99)

## 2018-11-16 LAB — OSMOLALITY, URINE: Osmolality, Ur: 436 mOsm/kg (ref 300–900)

## 2018-11-16 LAB — BASIC METABOLIC PANEL
Anion gap: 10 (ref 5–15)
BUN: 13 mg/dL (ref 8–23)
CO2: 23 mmol/L (ref 22–32)
Calcium: 8.4 mg/dL — ABNORMAL LOW (ref 8.9–10.3)
Chloride: 92 mmol/L — ABNORMAL LOW (ref 98–111)
Creatinine, Ser: 1.08 mg/dL — ABNORMAL HIGH (ref 0.44–1.00)
GFR calc Af Amer: 53 mL/min — ABNORMAL LOW (ref >60)
GFR calc non Af Amer: 46 mL/min — ABNORMAL LOW (ref >60)
Glucose, Bld: 99 mg/dL (ref 70–99)
Potassium: 3.6 mmol/L (ref 3.5–5.1)
Sodium: 125 mmol/L — ABNORMAL LOW (ref 135–145)

## 2018-11-16 LAB — SODIUM, URINE, RANDOM: Sodium, Ur: 56 mmol/L

## 2018-11-16 MED ORDER — SODIUM CHLORIDE 0.9 % IV SOLN: INTRAVENOUS | Status: DC

## 2018-11-16 MED ORDER — COSYNTROPIN 0.25 MG IJ SOLR
0.2500 mg | Freq: Once | INTRAMUSCULAR | Status: AC
  Administered 2018-11-17: 0.25 mg via INTRAVENOUS
  Filled 2018-11-16: qty 0.25

## 2018-11-16 NOTE — Plan of Care (Signed)
  Problem: Education: Goal: Knowledge of General Education information will improve Description Including pain rating scale, medication(s)/side effects and non-pharmacologic comfort measures Outcome: Adequate for Discharge   Problem: Health Behavior/Discharge Planning: Goal: Ability to manage health-related needs will improve Outcome: Adequate for Discharge   

## 2018-11-16 NOTE — Evaluation (Signed)
Physical Therapy Re-Evaluation Patient Details Name: Cheryl Graves MRN: [id number] DOB: 1928/08/14 Today's Date: 11/16/2018   History of Present Illness  Cheryl Graves is an 85 y.o. female with medical history significant of type 2 diabetes, hypertension presenting to the ED for evaluation of generalized weakness.  History provided by patient and her niece at bedside.  Patient was initially diagnosed with a UTI about 2 weeks ago and treated with an antibiotic.  She continued to be symptomatic so then another antibiotic (Macrobid) was prescribed.  Patient continues to feel tired, lightheaded, and has no appetite.  Clinical Impression   Pt admitted with above diagnosis. Discussed case with Dr. Lonny Prude, who informed me that [name] does not have reliable assist at home; She performed well with me with encouragement, participated in mobility and ADL tasks; I believe that a SNF stay for short-term rehab will benefit [name], and will maximize independence and safety with mobility and ADLs prior to dc home; Will update dc recommendations;  Pt currently with functional limitations due to the deficits listed below (see PT Problem List). Pt will benefit from skilled PT to increase their independence and safety with mobility to allow discharge to the venue listed below.       Follow Up Recommendations SNF    Equipment Recommendations  Rolling walker with 5" wheels;3in1 (PT)    Recommendations for Other Services       Precautions / Restrictions Precautions Precautions: Fall Restrictions Weight Bearing Restrictions: No      Mobility  Bed Mobility Overal bed mobility: Needs Assistance Bed Mobility: Supine to Sit     Supine to sit: Min assist     General bed mobility comments: min handheld assist to pull to sit  Transfers Overall transfer level: Needs assistance Equipment used: Rolling walker (2 wheeled) Transfers: Sit to/from Stand Sit to Stand: Min assist          General transfer comment: Cues for hand placement and safety, and to self-monitor for activity tolerance; palpable crepitus R LE due to OA; decr control of descent ot sit  Ambulation/Gait Ambulation/Gait assistance: Min assist Gait Distance (Feet): 20 Feet(to and from bathroom) Assistive device: Rolling walker (2 wheeled) Gait Pattern/deviations: Step-through pattern;Antalgic;Trunk flexed;Decreased step length - right Gait velocity: slow, unable to measure in room   General Gait Details: guarded gait due to R knee pain, instructed on sequencing to optimize benefit of RW to offload painful knee.  Pt deferred out of room  Stairs            Wheelchair Mobility    Modified Rankin (Stroke Patients Only)       Balance     Sitting balance-Leahy Scale: Good       Standing balance-Leahy Scale: Poor Standing balance comment: depends on device for stability                             Pertinent Vitals/Pain Pain Assessment: Faces Faces Pain Scale: Hurts a little bit Pain Location: right knee Pain Descriptors / Indicators: Aching Pain Intervention(s): Monitored during session    Home Living Family/patient expects to be discharged to:: Private residence Living Arrangements: Alone Available Help at Discharge: Family;Friend(s);Available 24 hours/day Type of Home: House                Prior Function Level of Independence: Independent with assistive device(s)         Comments: neice drives her to md  appts, grocery shops, housekeeping, and meals per pt     Hand Dominance        Extremity/Trunk Assessment   Upper Extremity Assessment Upper Extremity Assessment: Generalized weakness    Lower Extremity Assessment Lower Extremity Assessment: Generalized weakness;RLE deficits/detail RLE Deficits / Details: premorbid OA R knee, planned surgery Mar 2020 but cancelled due to COVID, pt encouraged to call surgeon about rescheduling(lacks <5 degree R knee  extension)       Communication   Communication: HOH  Cognition Arousal/Alertness: Awake/alert Behavior During Therapy: WFL for tasks assessed/performed Overall Cognitive Status: Within Functional Limits for tasks assessed                                 General Comments: reluctant to get up "tired" but does cooperate appropriately      General Comments      Exercises     Assessment/Plan    PT Assessment Patient needs continued PT services  PT Problem List Decreased strength;Decreased range of motion;Decreased activity tolerance;Decreased balance;Decreased mobility;Decreased knowledge of use of DME;Cardiopulmonary status limiting activity;Pain       PT Treatment Interventions DME instruction;Gait training;Stair training;Functional mobility training;Therapeutic activities;Therapeutic exercise;Balance training;Neuromuscular re-education;Cognitive remediation;Patient/family education    PT Goals (Current goals can be found in the Care Plan section)  Acute Rehab PT Goals Patient Stated Goal: feel better, go home PT Goal Formulation: With patient Time For Goal Achievement: 11/30/18 Potential to Achieve Goals: Good    Frequency Min 2X/week   Barriers to discharge Decreased caregiver support Unable to verify consistent assist; recommend SNF for post-acute rehab    Co-evaluation               AM-PAC PT "6 Clicks" Mobility  Outcome Measure Help needed turning from your back to your side while in a flat bed without using bedrails?: None Help needed moving from lying on your back to sitting on the side of a flat bed without using bedrails?: A Little Help needed moving to and from a bed to a chair (including a wheelchair)?: A Little Help needed standing up from a chair using your arms (e.g., wheelchair or bedside chair)?: A Little Help needed to walk in hospital room?: A Little Help needed climbing 3-5 steps with a railing? : A Lot 6 Click Score: 18     End of Session Equipment Utilized During Treatment: Gait belt Activity Tolerance: Patient tolerated treatment well Patient left: in chair;with call bell/phone within reach;Other (comment)(nursing student completing her bath) Nurse Communication: Mobility status PT Visit Diagnosis: Difficulty in walking, not elsewhere classified (R26.2);Muscle weakness (generalized) (M62.81);Pain Pain - Right/Left: Right Pain - part of body: Knee    Time: 1110-1130 PT Time Calculation (min) (ACUTE ONLY): 20 min   Charges:   PT Evaluation $PT Re-evaluation: 1 Re-eval          Van Clines, PT  Acute Rehabilitation Services Pager [phone] Office [phone]   Levi Aland 11/16/2018, 1:17 PM

## 2018-11-16 NOTE — Progress Notes (Signed)
PROGRESS NOTE    Cheryl Graves  LDJ:[id number] DOB: Nov 01, 1928 DOA: 11/13/2018 PCP: [name]   Brief Narrative: Cheryl Graves is a 85 y.o. female with medical history significant of type 2 diabetes, hypertension. Patient presented secondary to weakness with concern for UTI and evidence of hyponatremia.   Assessment & Plan:   Principal Problem:   UTI (urinary tract infection) Active Problems:   Hyponatremia   Type 2 diabetes mellitus (HCC)   Physical deconditioning   Hypertensive urgency   UTI Treated empirically with ceftriaxone and Keflex.  Hyponatremia ?chronic issue. Slightly worsened since initial improvement. Unknown etiology. Hydrochlorothiazide held on admission. No symptoms. Patient is euvolemic. -Urine sodium/osmolality  Diabetes mellitus, type 2 On Amaryl as an outpatient. Will need to consider discontinuing this on discharge. Hemoglobin A1C of 5.7% so would recommend diet control only -SSI while inpatient  Essential hypertension -Continue lisinopril and hydralazine  Weakness Possibly secondary to presumed UTI. It is possible this could be secondary to hyponatremia. PT recommending SNF  Nausea Resolved.   DVT prophylaxis: Lovenox Code Status:   Code Status: Full Code Family Communication: Niece on telephone Disposition Plan: Discharge to SNF in 2-3 days   Consultants:   None  Procedures:   None  Antimicrobials:  Ceftriaxone  Keflex    Subjective: Weak. No nausea this morning.  Objective: Vitals:   11/16/18 0116 11/16/18 0249 11/16/18 0440 11/16/18 0800  BP: (!) 172/64 (!) 152/52 (!) 152/54 138/63  Pulse: 71 72 71 77  Resp:   (!) 8   Temp:   98.6 F (37 C) (!) 97.5 F (36.4 C)  TempSrc:   Oral Oral  SpO2:   97% 96%  Weight:   68 kg   Height:        Intake/Output Summary (Last 24 hours) at 11/16/2018 1023 Last data filed at 11/16/2018 0449 Gross per 24 hour  Intake 1431.23 ml  Output  1200 ml  Net 231.23 ml   Filed Weights   11/14/18 0107 11/15/18 0420 11/16/18 0440  Weight: 68.9 kg 68.2 kg 68 kg    Examination:  General exam: Appears calm and comfortable Respiratory system: Clear to auscultation. Respiratory effort normal. Cardiovascular system: S1 & S2 heard, RRR. No murmurs, rubs, gallops or clicks. Gastrointestinal system: Abdomen is nondistended, soft and nontender. No organomegaly or masses felt. Normal bowel sounds heard. Central nervous system: Alert and oriented. No focal neurological deficits. Extremities: No edema. No calf tenderness Skin: No cyanosis. No rashes Psychiatry: Judgement and insight appear normal. Mood & affect appropriate.     Data Reviewed: I have personally reviewed following labs and imaging studies  CBC: Recent Labs  Lab 11/13/18 2132 11/14/18 0544 11/15/18 0718  WBC 11.1* 8.2 7.8  HGB 13.2 12.3 12.1  HCT 38.5 36.2 34.7*  MCV 92.5 92.1 90.4  PLT 351 315 312   Basic Metabolic Panel: Recent Labs  Lab 11/13/18 2132 11/14/18 0028 11/14/18 0544 11/15/18 0718 11/16/18 0757  NA 126* 127* 127* 127* 125*  K 4.0 3.9 3.6 3.5 3.6  CL 89* 94* 100 93* 92*  CO2 26 22 22  21* 23  GLUCOSE 144* 118* 112* 139* 99  BUN 20 19 16 12 13   CREATININE 1.18* 1.06* 1.01* 1.02* 1.08*  CALCIUM 8.6* 8.0* 7.8* 8.3* 8.4*   GFR: Estimated Creatinine Clearance: 34.8 mL/min (A) (by C-G formula based on SCr of 1.08 mg/dL (H)). Liver Function Tests: No results for input(s): AST, ALT, ALKPHOS, BILITOT, PROT, ALBUMIN in the  last 168 hours. No results for input(s): LIPASE, AMYLASE in the last 168 hours. No results for input(s): AMMONIA in the last 168 hours. Coagulation Profile: No results for input(s): INR, PROTIME in the last 168 hours. Cardiac Enzymes: No results for input(s): CKTOTAL, CKMB, CKMBINDEX, TROPONINI in the last 168 hours. BNP (last 3 results) No results for input(s): PROBNP in the last 8760 hours. HbA1C: Recent Labs    11/14/18  0028  HGBA1C 5.7*   CBG: Recent Labs  Lab 11/15/18 0620 11/15/18 1208 11/15/18 1735 11/15/18 2112 11/16/18 0608  GLUCAP 130* 152* 120* 123* 163*   Lipid Profile: No results for input(s): CHOL, HDL, LDLCALC, TRIG, CHOLHDL, LDLDIRECT in the last 72 hours. Thyroid Function Tests: No results for input(s): TSH, T4TOTAL, FREET4, T3FREE, THYROIDAB in the last 72 hours. Anemia Panel: No results for input(s): VITAMINB12, FOLATE, FERRITIN, TIBC, IRON, RETICCTPCT in the last 72 hours. Sepsis Labs: No results for input(s): PROCALCITON, LATICACIDVEN in the last 168 hours.  Recent Results (from the past 240 hour(s))  SARS CORONAVIRUS 2 (TAT 6-24 HRS) Nasopharyngeal Nasopharyngeal Swab     Status: None   Collection Time: 11/14/18 12:17 AM   Specimen: Nasopharyngeal Swab  Result Value Ref Range Status   SARS Coronavirus 2 NEGATIVE NEGATIVE Final    Comment: (NOTE) SARS-CoV-2 target nucleic acids are NOT DETECTED. The SARS-CoV-2 RNA is generally detectable in upper and lower respiratory specimens during the acute phase of infection. Negative results do not preclude SARS-CoV-2 infection, do not rule out co-infections with other pathogens, and should not be used as the sole basis for treatment or other patient management decisions. Negative results must be combined with clinical observations, patient history, and epidemiological information. The expected result is Negative. Fact Sheet for Patients: SugarRoll.be Fact Sheet for Healthcare Providers: https://www.woods-mathews.com/ This test is not yet approved or cleared by the [place] FDA and  has been authorized for detection and/or diagnosis of SARS-CoV-2 by FDA under an Emergency Use Authorization (EUA). This EUA will remain  in effect (meaning this test can be used) for the duration of the COVID-19 declaration under Section 56 4(b)(1) of the Act, 21 U.S.C. section 360bbb-3(b)(1), unless  the authorization is terminated or revoked sooner. Performed at [facility], [place] [address], [place], [place] [address]   Culture, Urine     Status: None   Collection Time: 11/14/18  3:22 AM   Specimen: Urine, Random  Result Value Ref Range Status   Specimen Description URINE, RANDOM  Final   Special Requests Normal  Final   Culture   Final    NO GROWTH Performed at [facility], [place] [address], [place], [place] [address]    Report Status 11/15/2018 FINAL  Final         Radiology Studies: No results found.      Scheduled Meds: . aspirin EC  81 mg Oral QHS  . cephALEXin  250 mg Oral Q6H  . enoxaparin (LOVENOX) injection  40 mg Subcutaneous Q24H  . famotidine  20 mg Oral BID  . hydrALAZINE  50 mg Oral TID  . insulin aspart  0-5 Units Subcutaneous QHS  . insulin aspart  0-9 Units Subcutaneous TID WC  . lisinopril  40 mg Oral Daily   Continuous Infusions: . sodium chloride Stopped (11/15/18 2253)     LOS: 2 days     Cordelia Poche, MD [facility] 11/16/2018, 10:23 AM  If 7PM-7AM, please contact night-coverage [email or web address]

## 2018-11-16 NOTE — NC FL2 (Signed)
[place] MEDICAID FL2 LEVEL OF CARE SCREENING TOOL     IDENTIFICATION  Patient Name: Cheryl Graves Birthdate: 11-07-1928 Sex: female Admission Date (Current Location): 11/13/2018  [facility] and [place] Number:  Producer, television/film/video and Address:  [facility], [address], [place], [place] [address]      Provider Number: [id number]  Attending Physician Name and Address:  Narda Bonds, MD  Relative Name and Phone Number:  Pricilla Riffle [phone]    Current Level of Care: Hospital Recommended Level of Care: Skilled Nursing Facility Prior Approval Number:    Date Approved/Denied:   PASRR Number: [id number]  Discharge Plan: SNF    Current Diagnoses: Patient Active Problem List   Diagnosis Date Noted  . Hyponatremia 11/14/2018  . Type 2 diabetes mellitus (HCC) 11/14/2018  . Physical deconditioning 11/14/2018  . Hypertensive urgency 11/14/2018  . UTI (urinary tract infection) 11/13/2018    Orientation RESPIRATION BLADDER Height & Weight     Self, Time, Situation, Place  Normal Incontinent, External catheter Weight: 68 kg Height:  5\' 4"  (162.6 cm)  BEHAVIORAL SYMPTOMS/MOOD NEUROLOGICAL BOWEL NUTRITION STATUS      Continent Diet(see DC summary)  AMBULATORY STATUS COMMUNICATION OF NEEDS Skin   Extensive Assist Verbally Normal                       Personal Care Assistance Level of Assistance  Bathing, Feeding, Dressing, Total care Bathing Assistance: Limited assistance Feeding assistance: Independent Dressing Assistance: Limited assistance Total Care Assistance: Limited assistance   Functional Limitations Info  Sight, Hearing, Speech Sight Info: Adequate Hearing Info: Impaired(HOH) Speech Info: Adequate    SPECIAL CARE FACTORS FREQUENCY  PT (By licensed PT), OT (By licensed OT)     PT Frequency: 5x/week OT Frequency: 5x/week            Contractures Contractures Info: Not present    Additional Factors Info   Code Status, Allergies Code Status Info: Full Allergies Info: Ciprofloxacin, Metronidazole, Septra Sulfamethoxazole trimethoprim           Current Medications (11/16/2018):  This is the current hospital active medication list Current Facility-Administered Medications  Medication Dose Route Frequency Provider Last Rate Last Dose  . 0.9 %  sodium chloride infusion   Intravenous PRN 11/18/2018, DO   Stopped at 11/15/18 2253  . acetaminophen (TYLENOL) tablet 650 mg  650 mg Oral Q6H PRN 2254, MD   650 mg at 11/15/18 0848   Or  . acetaminophen (TYLENOL) suppository 650 mg  650 mg Rectal Q6H PRN 11/17/18, MD      . aspirin EC tablet 81 mg  81 mg Oral QHS John Giovanni U, DO   81 mg at 11/15/18 2108  . cephALEXin (KEFLEX) capsule 250 mg  250 mg Oral Q6H Kyle, Tyrone A, DO   250 mg at 11/16/18 1715  . enoxaparin (LOVENOX) injection 40 mg  40 mg Subcutaneous Q24H 11/18/18, MD   40 mg at 11/16/18 0854  . famotidine (PEPCID) tablet 20 mg  20 mg Oral BID 11/18/18 U, DO   20 mg at 11/16/18 0854  . hydrALAZINE (APRESOLINE) injection 5 mg  5 mg Intravenous Q4H PRN 11/18/18, MD   5 mg at 11/16/18 0122  . hydrALAZINE (APRESOLINE) tablet 50 mg  50 mg Oral TID 11/18/18 A, DO   50 mg at 11/16/18 1715  . insulin aspart (novoLOG) injection 0-5  Units  0-5 Units Subcutaneous QHS Shela Leff, MD      . insulin aspart (novoLOG) injection 0-9 Units  0-9 Units Subcutaneous TID WC Shela Leff, MD   2 Units at 11/16/18 1235  . lisinopril (ZESTRIL) tablet 40 mg  40 mg Oral Daily Eulogio Bear U, DO   40 mg at 11/16/18 0854  . traMADol (ULTRAM) tablet 50 mg  50 mg Oral Q12H PRN Geradine Girt, DO         Discharge Medications: Please see discharge summary for a list of discharge medications.  Relevant Imaging Results:  Relevant Lab Results:   Additional Information soc sec [phone]  Zenon Mayo, RN

## 2018-11-16 NOTE — TOC Initial Note (Signed)
Transition of Care [facility]) - Initial/Assessment Note    Patient Details  Name: Cheryl Graves MRN: [id number] Date of Birth: 1928/06/29  Transition of [facility]) CM/SW Contact:    Zenon Mayo, RN Phone Number: 11/16/2018, 5:39 PM  Clinical Narrative:                 Patient from home alone, NCM tried to speak with her, but she is [facility], she informed NCM to speak with her niece.  NCM contacted [name] [phone].  Informed her the pt eval rec SNF, she states yes she would like for her to go to [facility] for first choice, but go ahead and fax out to other SNF's in [place]. NCM faxed information out , awaiting bed offers.  Expected Discharge Plan: Skilled Nursing Facility Barriers to Discharge: No Barriers Identified   Patient Goals and CMS Choice Patient states their goals for this hospitalization and ongoing recovery are:: get better CMS Medicare.gov Compare Post Acute Care list provided to:: Patient Represenative (must comment)(neice [name]) Choice offered to / list presented to : (Niece)  Expected Discharge Plan and Services Expected Discharge Plan: [place] In-house Referral: NA Discharge Planning Services: CM Consult Post Acute Care Choice: [place] Living arrangements for the past 2 months: Single Family Home                 DME Arranged: (NA)         HH Arranged: NA          Prior Living Arrangements/Services Living arrangements for the past 2 months: Single Family Home Lives with:: Self Patient language and need for interpreter reviewed:: Yes Do you feel safe going back to the place where you live?: Yes      Need for Family Participation in Patient Care: Yes (Comment) Care giver support system in place?: Yes (comment)   Criminal Activity/Legal Involvement Pertinent to Current Situation/Hospitalization: No - Comment as needed  Activities of Daily Living Home Assistive Devices/Equipment: CBG Meter,  Eyeglasses, Walker (specify type) ADL Screening (condition at time of admission) Patient's cognitive ability adequate to safely complete daily activities?: Yes Is the patient deaf or have difficulty hearing?: Yes Does the patient have difficulty seeing, even when wearing glasses/contacts?: No Does the patient have difficulty concentrating, remembering, or making decisions?: No Patient able to express need for assistance with ADLs?: Yes Does the patient have difficulty dressing or bathing?: Yes Independently performs ADLs?: Yes (appropriate for developmental age) Does the patient have difficulty walking or climbing stairs?: Yes Weakness of Legs: Both Weakness of Arms/Hands: None  Permission Sought/Granted                  Emotional Assessment Appearance:: Appears stated age Attitude/Demeanor/Rapport: (HOH) Affect (typically observed): Appropriate Orientation: : Oriented to Self, Oriented to Place, Oriented to  Time, Oriented to Situation Alcohol / Substance Use: Not Applicable Psych Involvement: No (comment)  Admission diagnosis:  Hyponatremia [E87.1] Generalized weakness [R53.1] Patient Active Problem List   Diagnosis Date Noted  . Hyponatremia 11/14/2018  . Type 2 diabetes mellitus ([place]) 11/14/2018  . Physical deconditioning 11/14/2018  . Hypertensive urgency 11/14/2018  . UTI (urinary tract infection) 11/13/2018   PCP:  [place], [place]:   [id number] [place], [place] - [address] [place]  [address] Phone: [phone] Fax: [phone]     Social Determinants of  Health (SDOH) Interventions    Readmission Risk Interventions No flowsheet data found.

## 2018-11-17 LAB — BASIC METABOLIC PANEL
Anion gap: 11 (ref 5–15)
Anion gap: 11 (ref 5–15)
Anion gap: 11 (ref 5–15)
BUN: 21 mg/dL (ref 8–23)
BUN: 25 mg/dL — ABNORMAL HIGH (ref 8–23)
BUN: 26 mg/dL — ABNORMAL HIGH (ref 8–23)
CO2: 21 mmol/L — ABNORMAL LOW (ref 22–32)
CO2: 21 mmol/L — ABNORMAL LOW (ref 22–32)
CO2: 20 mmol/L — ABNORMAL LOW (ref 22–32)
Calcium: 7.9 mg/dL — ABNORMAL LOW (ref 8.9–10.3)
Calcium: 8.1 mg/dL — ABNORMAL LOW (ref 8.9–10.3)
Calcium: 8 mg/dL — ABNORMAL LOW (ref 8.9–10.3)
Chloride: 91 mmol/L — ABNORMAL LOW (ref 98–111)
Chloride: 94 mmol/L — ABNORMAL LOW (ref 98–111)
Chloride: 94 mmol/L — ABNORMAL LOW (ref 98–111)
Creatinine, Ser: 1.21 mg/dL — ABNORMAL HIGH (ref 0.44–1.00)
Creatinine, Ser: 1.3 mg/dL — ABNORMAL HIGH (ref 0.44–1.00)
Creatinine, Ser: 1.48 mg/dL — ABNORMAL HIGH (ref 0.44–1.00)
GFR calc Af Amer: 47 mL/min — ABNORMAL LOW (ref >60)
GFR calc Af Amer: 43 mL/min — ABNORMAL LOW (ref >60)
GFR calc Af Amer: 37 mL/min — ABNORMAL LOW (ref >60)
GFR calc non Af Amer: 40 mL/min — ABNORMAL LOW (ref >60)
GFR calc non Af Amer: 37 mL/min — ABNORMAL LOW (ref >60)
GFR calc non Af Amer: 32 mL/min — ABNORMAL LOW (ref >60)
Glucose, Bld: 125 mg/dL — ABNORMAL HIGH (ref 70–99)
Glucose, Bld: 124 mg/dL — ABNORMAL HIGH (ref 70–99)
Glucose, Bld: 123 mg/dL — ABNORMAL HIGH (ref 70–99)
Potassium: 3.7 mmol/L (ref 3.5–5.1)
Potassium: 3.9 mmol/L (ref 3.5–5.1)
Potassium: 3.6 mmol/L (ref 3.5–5.1)
Sodium: 123 mmol/L — ABNORMAL LOW (ref 135–145)
Sodium: 126 mmol/L — ABNORMAL LOW (ref 135–145)
Sodium: 125 mmol/L — ABNORMAL LOW (ref 135–145)

## 2018-11-17 LAB — GLUCOSE, CAPILLARY
Glucose-Capillary: 135 mg/dL — ABNORMAL HIGH (ref 70–99)
Glucose-Capillary: 166 mg/dL — ABNORMAL HIGH (ref 70–99)
Glucose-Capillary: 136 mg/dL — ABNORMAL HIGH (ref 70–99)
Glucose-Capillary: 150 mg/dL — ABNORMAL HIGH (ref 70–99)

## 2018-11-17 LAB — CORTISOL-AM, BLOOD: Cortisol - AM: 20 ug/dL (ref 6.7–22.6)

## 2018-11-17 LAB — TSH: TSH: 2.036 u[IU]/mL (ref 0.350–4.500)

## 2018-11-17 LAB — ACTH STIMULATION, 3 TIME POINTS
Cortisol, 30 Min: 41.7 ug/dL
Cortisol, 60 Min: 53.5 ug/dL
Cortisol, Base: 19.1 ug/dL

## 2018-11-17 MED ORDER — ENOXAPARIN SODIUM 30 MG/0.3ML ~~LOC~~ SOLN
30.0000 mg | SUBCUTANEOUS | Status: DC
  Administered 2018-11-18: 30 mg via SUBCUTANEOUS
  Filled 2018-11-17: qty 0.3

## 2018-11-17 MED ORDER — SODIUM CHLORIDE 0.9 % IV SOLN
INTRAVENOUS | Status: DC
  Administered 2018-11-17 (×2): via INTRAVENOUS

## 2018-11-17 NOTE — Plan of Care (Signed)
  Problem: Education: Goal: Knowledge of General Education information will improve Description Including pain rating scale, medication(s)/side effects and non-pharmacologic comfort measures Outcome: Progressing   Problem: Health Behavior/Discharge Planning: Goal: Ability to manage health-related needs will improve Outcome: Progressing   

## 2018-11-17 NOTE — TOC Progression Note (Addendum)
Transition of Care [facility]) - Progression Note    Patient Details  Name: Cheryl Graves MRN: [id number] Date of Birth: Feb 26, 1928  Transition of Care [facility]) CM/SW Contact  Zenon Mayo, RN Phone Number: 11/17/2018, 10:04 AM  Clinical Narrative:    NCM spoke with Billie Ruddy at [facility], they can take patient today, around 2 or 3 pm if she is ready, covid from 9/27 is good for today.  Per MD patient NA is 123, will need to give some IVF's and salt tabs, she will poss be ready tomorrow.  NCM informed GraceAnn with [facility], she states she can take patient tomorrow as well, will need rapid covid, Network engineer will Biomedical engineer.   Expected Discharge Plan: Skilled Nursing Facility Barriers to Discharge: No Barriers Identified  Expected Discharge Plan and Services Expected Discharge Plan: [place] In-house Referral: NA Discharge Planning Services: CM Consult Post Acute Care Choice: [place] Living arrangements for the past 2 months: Single Family Home                 DME Arranged: (NA)         HH Arranged: NA           Social Determinants of Health (SDOH) Interventions    Readmission Risk Interventions No flowsheet data found.

## 2018-11-17 NOTE — Progress Notes (Signed)
PROGRESS NOTE    Cheryl LOUISSAINT  XIP:[id number] DOB: Dec 21, 1928 DOA: 11/13/2018 PCP: [facility]   Brief Narrative: Cheryl Graves is a 85 y.o. female with medical history significant of type 2 diabetes, hypertension. Patient presented secondary to weakness with concern for UTI and evidence of hyponatremia.   Assessment & Plan:   Principal Problem:   UTI (urinary tract infection) Active Problems:   Hyponatremia   Type 2 diabetes mellitus (HCC)   Physical deconditioning   Hypertensive urgency   UTI Treated empirically with ceftriaxone and Keflex.  Hyponatremia ?chronic issue. Continued worsening since initial improvement. Unknown etiology. Hydrochlorothiazide held on admission. No symptoms. Patient is euvolemic. -Normal saline IV  Diabetes mellitus, type 2 On Amaryl as an outpatient. Will need to consider discontinuing this on discharge. Hemoglobin A1C of 5.7% so would recommend diet control only -SSI while inpatient  Essential hypertension -Continue lisinopril and hydralazine  Weakness Possibly secondary to presumed UTI. It is possible this could be secondary to hyponatremia. PT recommending SNF.  Nausea Resolved.   DVT prophylaxis: Lovenox Code Status:   Code Status: Full Code Family Communication: Niece on telephone Disposition Plan: Discharge to SNF in 2-3 days   Consultants:   None  Procedures:   None  Antimicrobials:  Ceftriaxone  Keflex    Subjective: No issues today.  Objective: Vitals:   11/16/18 2156 11/17/18 0459 11/17/18 0855 11/17/18 1206  BP: (!) 125/43 (!) 142/54 (!) 149/54 (!) 144/61  Pulse:  70 84 95  Resp:  18  18  Temp:  98.2 F (36.8 C)  98.6 F (37 C)  TempSrc:  Oral  Oral  SpO2:  95% 97% 97%  Weight:  66.3 kg    Height:        Intake/Output Summary (Last 24 hours) at 11/17/2018 1310 Last data filed at 11/17/2018 1304 Gross per 24 hour  Intake 1542 ml  Output 900 ml  Net 642 ml    Filed Weights   11/15/18 0420 11/16/18 0440 11/17/18 0459  Weight: 68.2 kg 68 kg 66.3 kg    Examination:  General exam: Appears calm and comfortable Respiratory system: Clear to auscultation. Respiratory effort normal. Cardiovascular system: S1 & S2 heard, RRR. No murmurs, rubs, gallops or clicks. Gastrointestinal system: Abdomen is nondistended, soft and nontender. No organomegaly or masses felt. Normal bowel sounds heard. Central nervous system: Alert and oriented. No focal neurological deficits. Extremities: No edema. No calf tenderness Skin: No cyanosis. No rashes Psychiatry: Judgement and insight appear normal. Mood & affect appropriate.    Data Reviewed: I have personally reviewed following labs and imaging studies  CBC: Recent Labs  Lab 11/13/18 2132 11/14/18 0544 11/15/18 0718  WBC 11.1* 8.2 7.8  HGB 13.2 12.3 12.1  HCT 38.5 36.2 34.7*  MCV 92.5 92.1 90.4  PLT 351 315 673   Basic Metabolic Panel: Recent Labs  Lab 11/14/18 0028 11/14/18 0544 11/15/18 0718 11/16/18 0757 11/17/18 0638  NA 127* 127* 127* 125* 123*  K 3.9 3.6 3.5 3.6 3.7  CL 94* 100 93* 92* 91*  CO2 22 22 21* 23 21*  GLUCOSE 118* 112* 139* 99 125*  BUN 19 16 12 13 21   CREATININE 1.06* 1.01* 1.02* 1.08* 1.21*  CALCIUM 8.0* 7.8* 8.3* 8.4* 7.9*   GFR: Estimated Creatinine Clearance: 30.7 mL/min (A) (by C-G formula based on SCr of 1.21 mg/dL (H)). Liver Function Tests: No results for input(s): AST, ALT, ALKPHOS, BILITOT, PROT, ALBUMIN in the last 168 hours.  No results for input(s): LIPASE, AMYLASE in the last 168 hours. No results for input(s): AMMONIA in the last 168 hours. Coagulation Profile: No results for input(s): INR, PROTIME in the last 168 hours. Cardiac Enzymes: No results for input(s): CKTOTAL, CKMB, CKMBINDEX, TROPONINI in the last 168 hours. BNP (last 3 results) No results for input(s): PROBNP in the last 8760 hours. HbA1C: No results for input(s): HGBA1C in the last 72  hours. CBG: Recent Labs  Lab 11/16/18 1159 11/16/18 1625 11/16/18 2114 11/17/18 0600 11/17/18 1206  GLUCAP 190* 115* 150* 135* 166*   Lipid Profile: No results for input(s): CHOL, HDL, LDLCALC, TRIG, CHOLHDL, LDLDIRECT in the last 72 hours. Thyroid Function Tests: Recent Labs    11/17/18 0639  TSH 2.036   Anemia Panel: No results for input(s): VITAMINB12, FOLATE, FERRITIN, TIBC, IRON, RETICCTPCT in the last 72 hours. Sepsis Labs: No results for input(s): PROCALCITON, LATICACIDVEN in the last 168 hours.  Recent Results (from the past 240 hour(s))  SARS CORONAVIRUS 2 (TAT 6-24 HRS) Nasopharyngeal Nasopharyngeal Swab     Status: None   Collection Time: 11/14/18 12:17 AM   Specimen: Nasopharyngeal Swab  Result Value Ref Range Status   SARS Coronavirus 2 NEGATIVE NEGATIVE Final    Comment: (NOTE) SARS-CoV-2 target nucleic acids are NOT DETECTED. The SARS-CoV-2 RNA is generally detectable in upper and lower respiratory specimens during the acute phase of infection. Negative results do not preclude SARS-CoV-2 infection, do not rule out co-infections with other pathogens, and should not be used as the sole basis for treatment or other patient management decisions. Negative results must be combined with clinical observations, patient history, and epidemiological information. The expected result is Negative. Fact Sheet for Patients: HairSlick.no Fact Sheet for Healthcare Providers: quierodirigir.com This test is not yet approved or cleared by the [place] FDA and  has been authorized for detection and/or diagnosis of SARS-CoV-2 by FDA under an Emergency Use Authorization (EUA). This EUA will remain  in effect (meaning this test can be used) for the duration of the COVID-19 declaration under Section 56 4(b)(1) of the Act, 21 U.S.C. section 360bbb-3(b)(1), unless the authorization is terminated or revoked sooner.  Performed at [facility], [address], [place], [place] [address]   Culture, Urine     Status: None   Collection Time: 11/14/18  3:22 AM   Specimen: Urine, Random  Result Value Ref Range Status   Specimen Description URINE, RANDOM  Final   Special Requests Normal  Final   Culture   Final    NO GROWTH Performed at [facility], [address], [place], [place] [address]    Report Status 11/15/2018 FINAL  Final         Radiology Studies: No results found.      Scheduled Meds: . aspirin EC  81 mg Oral QHS  . cephALEXin  250 mg Oral Q6H  . enoxaparin (LOVENOX) injection  40 mg Subcutaneous Q24H  . famotidine  20 mg Oral BID  . hydrALAZINE  50 mg Oral TID  . insulin aspart  0-5 Units Subcutaneous QHS  . insulin aspart  0-9 Units Subcutaneous TID WC  . lisinopril  40 mg Oral Daily   Continuous Infusions: . sodium chloride Stopped (11/15/18 2253)  . sodium chloride 100 mL/hr at 11/17/18 6812     LOS: 3 days     Jacquelin Hawking, MD [facility] 11/17/2018, 1:10 PM  If 7PM-7AM, please contact night-coverage [email or web address]

## 2018-11-18 DIAGNOSIS — R5381 Other malaise: Secondary | ICD-10-CM

## 2018-11-18 LAB — BASIC METABOLIC PANEL
Anion gap: 9 (ref 5–15)
BUN: 22 mg/dL (ref 8–23)
CO2: 21 mmol/L — ABNORMAL LOW (ref 22–32)
Calcium: 8 mg/dL — ABNORMAL LOW (ref 8.9–10.3)
Chloride: 97 mmol/L — ABNORMAL LOW (ref 98–111)
Creatinine, Ser: 1.13 mg/dL — ABNORMAL HIGH (ref 0.44–1.00)
GFR calc Af Amer: 51 mL/min — ABNORMAL LOW (ref >60)
GFR calc non Af Amer: 44 mL/min — ABNORMAL LOW (ref >60)
Glucose, Bld: 124 mg/dL — ABNORMAL HIGH (ref 70–99)
Potassium: 3.8 mmol/L (ref 3.5–5.1)
Sodium: 127 mmol/L — ABNORMAL LOW (ref 135–145)

## 2018-11-18 LAB — NOVEL CORONAVIRUS, NAA (HOSP ORDER, SEND-OUT TO REF LAB; TAT 18-24 HRS): SARS-CoV-2, NAA: NOT DETECTED

## 2018-11-18 LAB — GLUCOSE, CAPILLARY: Glucose-Capillary: 124 mg/dL — ABNORMAL HIGH (ref 70–99)

## 2018-11-18 MED ORDER — HYDRALAZINE HCL 50 MG PO TABS: 50.0000 mg | ORAL_TABLET | Freq: Three times a day (TID) | ORAL | Status: AC

## 2018-11-18 MED ORDER — LISINOPRIL 40 MG PO TABS: 40.0000 mg | ORAL_TABLET | Freq: Every day | ORAL | Status: DC

## 2018-11-18 MED ORDER — MIRABEGRON ER 25 MG PO TB24: 25.0000 mg | ORAL_TABLET | Freq: Every day | ORAL | Status: DC

## 2018-11-18 NOTE — Progress Notes (Signed)
Physical Therapy Treatment Patient Details Name: Cheryl Graves MRN: [id number] DOB: [birth year] Today's Date: 11/18/2018    History of Present Illness Cheryl Graves is an 85 y.o. female admitted with weakness, UTI. PMHx: DM, HTN    PT Comments    Pt very pleasant with clothes on and happy to move to work toward return home. Session limited by transporter arrival for D/C with pt demonstrating increased ability with transfers and gait with limited education for HEP.     Follow Up Recommendations  SNF     Equipment Recommendations  Rolling walker with 5" wheels;3in1 (PT)    Recommendations for Other Services       Precautions / Restrictions Precautions Precautions: Fall Restrictions Weight Bearing Restrictions: No    Mobility  Bed Mobility Overal bed mobility: Needs Assistance Bed Mobility: Supine to Sit     Supine to sit: Min guard     General bed mobility comments: increased time and effort with pt able to achieve sitting without physical assist  Transfers Overall transfer level: Needs assistance   Transfers: Sit to/from Stand Sit to Stand: Min assist         General transfer comment: cues for hand placement with slight assist to rise from surface, guarding to sit at stretcher with slightly decreased control of descent  Ambulation/Gait Ambulation/Gait assistance: Min guard Gait Distance (Feet): 30 Feet Assistive device: Rolling walker (2 wheeled) Gait Pattern/deviations: Step-through pattern;Trunk flexed;Decreased step length - right   Gait velocity interpretation: 1.31 - 2.62 ft/sec, indicative of limited community ambulator General Gait Details: cues for posture with pt reporting knee pain but without overt antalgic gait. Pt walked limited distance and declined further   Stairs             Wheelchair Mobility    Modified Rankin (Stroke Patients Only)       Balance Overall balance assessment: Needs assistance   Sitting balance-Leahy  Scale: Good Sitting balance - Comments: able to sit without assist   Standing balance support: During functional activity;Bilateral upper extremity supported Standing balance-Leahy Scale: Poor Standing balance comment: bil UE support in standing                            Cognition Arousal/Alertness: Awake/alert Behavior During Therapy: WFL for tasks assessed/performed Overall Cognitive Status: Within Functional Limits for tasks assessed                                        Exercises Total Joint Exercises Long Arc Quad: AROM;Both;Seated;15 reps    General Comments        Pertinent Vitals/Pain Pain Assessment: No/denies pain    Home Living                      Prior Function            PT Goals (current goals can now be found in the care plan section) Progress towards PT goals: Progressing toward goals    Frequency    Min 2X/week      PT Plan Current plan remains appropriate    Co-evaluation              AM-PAC PT "6 Clicks" Mobility   Outcome Measure  Help needed turning from your back to your side while in a flat bed without using bedrails?:  None Help needed moving from lying on your back to sitting on the side of a flat bed without using bedrails?: A Little Help needed moving to and from a bed to a chair (including a wheelchair)?: A Little Help needed standing up from a chair using your arms (e.g., wheelchair or bedside chair)?: A Little Help needed to walk in hospital room?: A Little Help needed climbing 3-5 steps with a railing? : A Little 6 Click Score: 19    End of Session Equipment Utilized During Treatment: Gait belt Activity Tolerance: Patient tolerated treatment well Patient left: Other (comment)(on stretcher for transport) Nurse Communication: Mobility status PT Visit Diagnosis: Difficulty in walking, not elsewhere classified (R26.2);Muscle weakness (generalized) (M62.81);Pain     Time:  4098-1191 PT Time Calculation (min) (ACUTE ONLY): 10 min  Charges:  $Gait Training: 8-22 mins                     Rondey Fallen Abner Greenspan, PT Acute Rehabilitation Services Pager: [phone] Office: [phone]    Enedina Finner Annora Guderian 11/18/2018, 12:43 PM

## 2018-11-18 NOTE — Discharge Summary (Signed)
Physician Discharge Summary  Cheryl FeilLinda F Graves ZOX:[id number] DOB: [birth year] DOA: 11/13/2018  [place]: [place]  Admit date: 11/13/2018 Discharge date: 11/18/2018  Admitted From: Home Disposition: SNF  Recommendations for Outpatient Follow-up:  1. Follow up with PCP in 1 week 2. Please obtain BMP/CBC in one week 3. Please follow up on the following pending results: None  Home Health: SNF Equipment/Devices: SNF  Discharge Condition: Stable CODE STATUS: Full code Diet recommendation: Regular diet   Brief/Interim Summary:  Admission HPI written by John GiovanniVasundhra Rathore, MD   Chief Complaint: Generalized weakness  HPI: Cheryl FeilLinda F Graves is a 85 y.o. female with medical history significant of type 2 diabetes, hypertension presenting to the ED for evaluation of generalized weakness.  History provided by patient and her niece at bedside.  Patient was initially diagnosed with a UTI about 2 weeks ago and treated with an antibiotic.  She continued to be symptomatic so then another antibiotic (Macrobid) was prescribed.  Patient continues to feel tired, lightheaded, and has no appetite.  She has been feeling nauseous but has not vomited.  Denies dysuria.  Does report urinary incontinence.  Denies fevers or chills.  Denies headaches, chest pain, shortness of breath, abdominal pain, or diarrhea.  She takes one blood pressure medication at home which is lisinopril-hydrochlorothiazide.  ED Course: Blood pressure significantly elevated, remainder of vitals stable.  White count mildly elevated at 11.1.  Corrected sodium 127.  Creatinine 1.1, improved since labs done 2 years ago when it was 1.8.  UA with positive nitrite, large amount of leukocytes, and greater than 50 WBCs. Patient received 1 L normal saline.    Hospital course:  UTI Treated empirically with ceftriaxone and Keflex. Urine culture was significant for no growth.  Urine incontinence/urgency/nocturia  Patient possibly has bladder irritability issues rather than a UTI. Will discharge on Myrbetriq 25 mg daily. Recommend adjusting as needed base on kidney function.  Hyponatremia Appears to be a chronic issue. hydrochlorothiazide discontinued while inpatient. some fluctuation during hospital stay. As low as 123 with improvement back to 127 with IV fluids. Patient is not symptomatic. She is euvolemic. Cortisol and ACTH stim test suggest no evidence of glucocorticoid insufficiency. Discontinued hydrochlorothiazide on discharge. Recommend continued observation/management.   Diabetes mellitus, type 2 On Amaryl as an outpatient. Hemoglobin A1C of 5.7% so would recommend diet control only. Discontinued Amaryl on discharge.   Essential hypertension Patient on lisinopril-hydrochlorothiazide as an outpatient. Started on hydralazine and continued lisinopril while inpatient. Discharge on lisinopril and hydralazine on discharge.  Weakness Possibly secondary to presumed UTI. It is possible this could be secondary to hyponatremia. PT recommending SNF.  Nausea Resolved.  Discharge Diagnoses:  Principal Problem:   UTI (urinary tract infection) Active Problems:   Hyponatremia   Type 2 diabetes mellitus (HCC)   Physical deconditioning   Hypertensive urgency    Discharge Instructions  Discharge Instructions    Call MD for:  persistant nausea and vomiting   Complete by: As directed    Call MD for:  redness, tenderness, or signs of infection (pain, swelling, redness, odor or green/yellow discharge around incision site)   Complete by: As directed    Increase activity slowly   Complete by: As directed      Allergies as of 11/18/2018      Reactions   Ciprofloxacin Diarrhea, Nausea Only   Metronidazole Diarrhea, Nausea Only   Septra [sulfamethoxazole-trimethoprim] Diarrhea      Medication List    STOP taking  these medications   glimepiride 2 MG tablet Commonly known as: AMARYL    ibuprofen 200 MG tablet Commonly known as: ADVIL   lisinopril-hydrochlorothiazide 20-12.5 MG tablet Commonly known as: ZESTORETIC   nitrofurantoin 100 MG capsule Commonly known as: MACRODANTIN   traMADol 50 MG tablet Commonly known as: ULTRAM     TAKE these medications   acetaminophen 325 MG tablet Commonly known as: TYLENOL Take 650 mg by mouth every 6 (six) hours as needed for moderate pain.   aspirin EC 81 MG tablet Take 81 mg by mouth at bedtime.   famotidine 20 MG tablet Commonly known as: PEPCID Take 20 mg by mouth 2 (two) times daily.   hydrALAZINE 50 MG tablet Commonly known as: APRESOLINE Take 1 tablet (50 mg total) by mouth 3 (three) times daily.   lisinopril 40 MG tablet Commonly known as: ZESTRIL Take 1 tablet (40 mg total) by mouth daily. Start taking on: November 19, 2018   mirabegron ER 25 MG Tb24 tablet Commonly known as: MYRBETRIQ Take 1 tablet (25 mg total) by mouth daily.   ondansetron 4 MG tablet Commonly known as: ZOFRAN Take 4 mg by mouth every 8 (eight) hours as needed for nausea or vomiting.      Contact information for after-discharge care    Destination    [facility] Preferred SNF .   Service: Skilled Nursing Contact information: [address] [place] [address] [phone]             Allergies  Allergen Reactions  . Ciprofloxacin Diarrhea and Nausea Only  . Metronidazole Diarrhea and Nausea Only  . Septra [Sulfamethoxazole-Trimethoprim] Diarrhea    Consultations:  None   Procedures/Studies: No results found.   Subjective: No issues overnight.  Discharge Exam: Vitals:   11/18/18 0400 11/18/18 0832  BP: (!) 129/49 (!) 123/45  Pulse: 73 80  Resp: 20   Temp: 98.5 F (36.9 C)   SpO2: 97% 99%   Vitals:   11/17/18 1947 11/18/18 0400 11/18/18 0619 11/18/18 0832  BP: (!) 147/52 (!) 129/49  (!) 123/45  Pulse: 74 73  80  Resp: 18 20    Temp: 98.5 F (36.9  C) 98.5 F (36.9 C)    TempSrc: Oral Oral    SpO2: 96% 97%  99%  Weight:   66 kg   Height:        General: Pt is alert, awake, not in acute distress Cardiovascular: RRR, S1/S2 +, no rubs, no gallops Respiratory: CTA bilaterally, no wheezing, no rhonchi Abdominal: Soft, NT, ND, bowel sounds + Extremities: no edema, no cyanosis    The results of significant diagnostics from this hospitalization (including imaging, microbiology, ancillary and laboratory) are listed below for reference.     Microbiology: Recent Results (from the past 240 hour(s))  SARS CORONAVIRUS 2 (TAT 6-24 HRS) Nasopharyngeal Nasopharyngeal Swab     Status: None   Collection Time: 11/14/18 12:17 AM   Specimen: Nasopharyngeal Swab  Result Value Ref Range Status   SARS Coronavirus 2 NEGATIVE NEGATIVE Final    Comment: (NOTE) SARS-CoV-2 target nucleic acids are NOT DETECTED. The SARS-CoV-2 RNA is generally detectable in upper and lower respiratory specimens during the acute phase of infection. Negative results do not preclude SARS-CoV-2 infection, do not rule out co-infections with other pathogens, and should not be used as the sole basis for treatment or other patient management decisions. Negative results must be combined with clinical observations, patient history, and epidemiological  information. The expected result is Negative. Fact Sheet for Patients: HairSlick.no Fact Sheet for Healthcare Providers: quierodirigir.com This test is not yet approved or cleared by the [place] FDA and  has been authorized for detection and/or diagnosis of SARS-CoV-2 by FDA under an Emergency Use Authorization (EUA). This EUA will remain  in effect (meaning this test can be used) for the duration of the COVID-19 declaration under Section 56 4(b)(1) of the Act, 21 U.S.C. section 360bbb-3(b)(1), unless the authorization is terminated or revoked sooner. Performed at  [facility], [address] [place], [place] [address]   Culture, Urine     Status: None   Collection Time: 11/14/18  3:22 AM   Specimen: Urine, Random  Result Value Ref Range Status   Specimen Description URINE, RANDOM  Final   Special Requests Normal  Final   Culture   Final    NO GROWTH Performed at [facility], [address], [place], [place] [address]    Report Status 11/15/2018 FINAL  Final     Labs: BNP (last 3 results) No results for input(s): BNP in the last 8760 hours. Basic Metabolic Panel: Recent Labs  Lab 11/16/18 0757 11/17/18 0638 11/17/18 1632 11/17/18 1958 11/18/18 0447  NA 125* 123* 126* 125* 127*  K 3.6 3.7 3.9 3.6 3.8  CL 92* 91* 94* 94* 97*  CO2 23 21* 21* 20* 21*  GLUCOSE 99 125* 124* 123* 124*  BUN 13 21 25* 26* 22  CREATININE 1.08* 1.21* 1.30* 1.48* 1.13*  CALCIUM 8.4* 7.9* 8.1* 8.0* 8.0*   Liver Function Tests: No results for input(s): AST, ALT, ALKPHOS, BILITOT, PROT, ALBUMIN in the last 168 hours. No results for input(s): LIPASE, AMYLASE in the last 168 hours. No results for input(s): AMMONIA in the last 168 hours. CBC: Recent Labs  Lab 11/13/18 2132 11/14/18 0544 11/15/18 0718  WBC 11.1* 8.2 7.8  HGB 13.2 12.3 12.1  HCT 38.5 36.2 34.7*  MCV 92.5 92.1 90.4  PLT 351 315 312   Cardiac Enzymes: No results for input(s): CKTOTAL, CKMB, CKMBINDEX, TROPONINI in the last 168 hours. BNP: Invalid input(s): POCBNP CBG: Recent Labs  Lab 11/17/18 0600 11/17/18 1206 11/17/18 1606 11/17/18 2159 11/18/18 0648  GLUCAP 135* 166* 136* 150* 124*   D-Dimer No results for input(s): DDIMER in the last 72 hours. Hgb A1c No results for input(s): HGBA1C in the last 72 hours. Lipid Profile No results for input(s): CHOL, HDL, LDLCALC, TRIG, CHOLHDL, LDLDIRECT in the last 72 hours. Thyroid function studies Recent Labs    11/17/18 0639  TSH 2.036   Anemia work up No results for input(s): VITAMINB12, FOLATE, FERRITIN, TIBC,  IRON, RETICCTPCT in the last 72 hours. Urinalysis    Component Value Date/Time   COLORURINE YELLOW 11/13/2018 2200   APPEARANCEUR CLOUDY (A) 11/13/2018 2200   LABSPEC 1.009 11/13/2018 2200   PHURINE 6.0 11/13/2018 2200   GLUCOSEU NEGATIVE 11/13/2018 2200   HGBUR SMALL (A) 11/13/2018 2200   BILIRUBINUR NEGATIVE 11/13/2018 2200   KETONESUR NEGATIVE 11/13/2018 2200   PROTEINUR NEGATIVE 11/13/2018 2200   NITRITE POSITIVE (A) 11/13/2018 2200   LEUKOCYTESUR LARGE (A) 11/13/2018 2200   Sepsis Labs Invalid input(s): PROCALCITONIN,  WBC,  LACTICIDVEN Microbiology Recent Results (from the past 240 hour(s))  SARS CORONAVIRUS 2 (TAT 6-24 HRS) Nasopharyngeal Nasopharyngeal Swab     Status: None   Collection Time: 11/14/18 12:17 AM   Specimen: Nasopharyngeal Swab  Result Value Ref Range Status   SARS  Coronavirus 2 NEGATIVE NEGATIVE Final    Comment: (NOTE) SARS-CoV-2 target nucleic acids are NOT DETECTED. The SARS-CoV-2 RNA is generally detectable in upper and lower respiratory specimens during the acute phase of infection. Negative results do not preclude SARS-CoV-2 infection, do not rule out co-infections with other pathogens, and should not be used as the sole basis for treatment or other patient management decisions. Negative results must be combined with clinical observations, patient history, and epidemiological information. The expected result is Negative. Fact Sheet for Patients: SugarRoll.be Fact Sheet for Healthcare Providers: https://www.woods-mathews.com/ This test is not yet approved or cleared by the [place] FDA and  has been authorized for detection and/or diagnosis of SARS-CoV-2 by FDA under an Emergency Use Authorization (EUA). This EUA will remain  in effect (meaning this test can be used) for the duration of the COVID-19 declaration under Section 56 4(b)(1) of the Act, 21 U.S.C. section 360bbb-3(b)(1), unless the  authorization is terminated or revoked sooner. Performed at [facility], [place] [address], [place], [place] [address]   Culture, Urine     Status: None   Collection Time: 11/14/18  3:22 AM   Specimen: Urine, Random  Result Value Ref Range Status   Specimen Description URINE, RANDOM  Final   Special Requests Normal  Final   Culture   Final    NO GROWTH Performed at [facility], [place] [address] [place], [place] [address]    Report Status 11/15/2018 FINAL  Final     Time coordinating discharge: 35 minutes  SIGNED:   Cordelia Poche, MD [facility] 11/18/2018, 9:23 AM

## 2018-11-18 NOTE — Progress Notes (Signed)
Called - Spoke to [place] at [facility].  Gave update on patient including going over new medications, Orientation, etc.     Paperwork sent with PTAR to facility.

## 2018-11-18 NOTE — Progress Notes (Signed)
PIV consult: Pt is scheduled to be discharged today per case manager. Discussed with Anderson Malta, RN: No IV needed at this time. She will enter STAT consult if pt requires urgent med before discharge.

## 2018-11-18 NOTE — Plan of Care (Signed)
  Problem: Elimination: Goal: Will not experience complications related to bowel motility Outcome: Progressing Goal: Will not experience complications related to urinary retention Outcome: Progressing   Problem: Safety: Goal: Ability to remain free from injury will improve Outcome: Progressing   Problem: Skin Integrity: Goal: Risk for impaired skin integrity will decrease Outcome: Progressing   Problem: Clinical Measurements: Goal: Respiratory complications will improve Outcome: Progressing

## 2018-11-18 NOTE — Discharge Instructions (Signed)
Hyponatremia Hyponatremia is when the amount of salt (sodium) in your blood is too low. When sodium levels are low, your cells absorb extra water, which causes them to swell. The swelling happens throughout the body, but it mostly affects the brain. What are the causes? This condition may be caused by:  Certain medical conditions, such as: ? Heart, kidney, or liver problems. ? Thyroid problems. ? Adrenal gland problems. ? Metabolic conditions, such as Addison disease or syndrome of inappropriate antidiuretic hormone (SIADH).  Severe vomiting or diarrhea.  Certain medicines or illegal drugs.  Dehydration.  Drinking too much water.  Eating a diet that is low in sodium.  Large burns on your body.  Excessive sweating. What increases the risk? You are more likely to develop this condition if you:  Have long-term (chronic) kidney disease.  Have heart failure.  Have a medical condition that causes frequent or excessive diarrhea.  Participate in intense physical activities, such as marathon running.  Take certain medicines that affect the sodium and fluid balance in the blood. Some of these medicine types include: ? Diuretics. ? NSAIDs. ? Some opioid pain medicines. ? Some antidepressants. ? Some seizure prevention medicines. What are the signs or symptoms? Symptoms of this condition include:  Headache.  Nausea and vomiting.  Being very tired (lethargic).  Muscle weakness and cramping.  Loss of appetite.  Feeling weak or light-headed. Severe symptoms of this condition include:  Confusion.  Agitation.  Having a rapid heart rate.  Passing out (fainting).  Seizures.  Coma. How is this diagnosed? This condition is diagnosed based on:  A physical exam.  Your medical history.  Tests, including: ? Blood tests. ? Urine tests. How is this treated? Treatment for this condition depends on the cause. Treatment may include:  Getting fluids through an IV  that is inserted into one of your veins.  Medicines to correct the sodium imbalance. If medicines are causing the condition, the medicines will need to be adjusted.  Limiting your water or fluid intake to get the correct sodium balance.  Monitoring in the hospital setting to closely watch your symptoms for improvement. Follow these instructions at home:   Take over-the-counter and prescription medicines only as told by your health care provider. Many medicines can make this condition worse. Talk with your health care provider about any medicines that you are currently taking.  Carefully follow a recommended diet as told by your health care provider.  Carefully follow instructions from your health care provider about fluid restrictions.  Do not drink alcohol.  Keep all follow-up visits as told by your health care provider. This is important. Contact a health care provider if:  You develop worsening nausea, fatigue, headache, confusion, or weakness.  Your symptoms go away and then return.  You have problems following the recommended diet. Get help right away if:  You have a seizure.  You pass out.  You have ongoing diarrhea or vomiting. Summary  Hyponatremia is when the amount of salt (sodium) in your blood is too low.  When sodium levels are low, your cells absorb extra water, which causes them to swell.  The swelling happens throughout the body, but it mostly affects the brain.  Treatment for this condition depends on the cause. It may include IV fluids, medicines, and limiting your fluid intake. This information is not intended to replace advice given to you by your health care provider. Make sure you discuss any questions you have with your health care provider. Document   Released: 01/24/2002 Document Revised: 12/18/2017 Document Reviewed: 12/18/2017 Elsevier Patient Education  2020 Elsevier Inc.  

## 2018-11-18 NOTE — TOC Progression Note (Addendum)
Transition of Care [facility]) - Progression Note    Patient Details  Name: Cheryl Graves MRN: [id number] Date of Birth: 06/07/1928  [facility] CM/SW Contact  Zenon Mayo, RN Phone Number: 11/18/2018, 8:52 AM  Clinical Narrative:    Patient for dc to [facility] today via [facility],  NCM notified her niece [name], who will be going to facility to do paper work this am.  Her Covid test are good ,we do not need another one.  She will go to room 36, Staff RN to call report to [phone].  [facility] has been called for transport.   Expected Discharge Plan: Skilled Nursing Facility Barriers to Discharge: No Barriers Identified  Expected Discharge Plan and Services Expected Discharge Plan: [place] In-house Referral: NA Discharge Planning Services: CM Consult Post Acute Care Choice: [place] Living arrangements for the past 2 months: Single Family Home                 DME Arranged: (NA)         HH Arranged: NA           Social Determinants of Health (SDOH) Interventions    Readmission Risk Interventions No flowsheet data found.

## 2018-11-18 NOTE — Progress Notes (Signed)
Received consult for PIV. No current scheduled medications. Last prn hydralazine given on 9/29 with SBP 120-140's. Attempted to call RN to further assess need for PIV; no answer at this time.

## 2018-12-14 ENCOUNTER — Emergency Department (HOSPITAL_COMMUNITY)
Admission: EM | Admit: 2018-12-14 | Discharge: 2018-12-14 | Disposition: A | Discharge status: Home / Self Care | Payer: Medicare Other | Attending: Emergency Medicine | Admitting: Emergency Medicine

## 2018-12-14 ENCOUNTER — Encounter (HOSPITAL_COMMUNITY): Payer: Self-pay

## 2018-12-14 ENCOUNTER — Other Ambulatory Visit: Payer: Self-pay

## 2018-12-14 ENCOUNTER — Emergency Department (HOSPITAL_COMMUNITY): Payer: Medicare Other

## 2018-12-14 DIAGNOSIS — I4891 Unspecified atrial fibrillation: Secondary | ICD-10-CM | POA: Diagnosis present

## 2018-12-14 DIAGNOSIS — Z7982 Long term (current) use of aspirin: Secondary | ICD-10-CM | POA: Insufficient documentation

## 2018-12-14 DIAGNOSIS — F17200 Nicotine dependence, unspecified, uncomplicated: Secondary | ICD-10-CM | POA: Diagnosis not present

## 2018-12-14 DIAGNOSIS — I480 Paroxysmal atrial fibrillation: Secondary | ICD-10-CM | POA: Insufficient documentation

## 2018-12-14 DIAGNOSIS — E119 Type 2 diabetes mellitus without complications: Secondary | ICD-10-CM | POA: Diagnosis not present

## 2018-12-14 DIAGNOSIS — Z79899 Other long term (current) drug therapy: Secondary | ICD-10-CM | POA: Insufficient documentation

## 2018-12-14 DIAGNOSIS — I10 Essential (primary) hypertension: Secondary | ICD-10-CM | POA: Diagnosis not present

## 2018-12-14 LAB — CBC
HCT: 32.5 % — ABNORMAL LOW (ref 36.0–46.0)
Hemoglobin: 10.7 g/dL — ABNORMAL LOW (ref 12.0–15.0)
MCH: 30.6 pg (ref 26.0–34.0)
MCHC: 32.9 g/dL (ref 30.0–36.0)
MCV: 92.9 fL (ref 80.0–100.0)
Platelets: 427 10*3/uL — ABNORMAL HIGH (ref 150–400)
RBC: 3.5 MIL/uL — ABNORMAL LOW (ref 3.87–5.11)
RDW: 13.2 % (ref 11.5–15.5)
WBC: 8.9 10*3/uL (ref 4.0–10.5)
nRBC: 0 % (ref 0.0–0.2)

## 2018-12-14 LAB — BASIC METABOLIC PANEL
Anion gap: 12 (ref 5–15)
BUN: 25 mg/dL — ABNORMAL HIGH (ref 8–23)
CO2: 20 mmol/L — ABNORMAL LOW (ref 22–32)
Calcium: 7.9 mg/dL — ABNORMAL LOW (ref 8.9–10.3)
Chloride: 95 mmol/L — ABNORMAL LOW (ref 98–111)
Creatinine, Ser: 1.59 mg/dL — ABNORMAL HIGH (ref 0.44–1.00)
GFR calc Af Amer: 33 mL/min — ABNORMAL LOW (ref >60)
GFR calc non Af Amer: 29 mL/min — ABNORMAL LOW (ref >60)
Glucose, Bld: 112 mg/dL — ABNORMAL HIGH (ref 70–99)
Potassium: 4.3 mmol/L (ref 3.5–5.1)
Sodium: 127 mmol/L — ABNORMAL LOW (ref 135–145)

## 2018-12-14 LAB — PROTIME-INR
INR: 1 (ref 0.8–1.2)
Prothrombin Time: 12.6 seconds (ref 11.4–15.2)

## 2018-12-14 LAB — MAGNESIUM: Magnesium: 1.8 mg/dL (ref 1.7–2.4)

## 2018-12-14 LAB — TSH: TSH: 2.231 u[IU]/mL (ref 0.350–4.500)

## 2018-12-14 MED ORDER — METOPROLOL TARTRATE 25 MG PO TABS
12.5000 mg | ORAL_TABLET | Freq: Once | ORAL | Status: AC
  Administered 2018-12-14: 12.5 mg via ORAL
  Filled 2018-12-14: qty 1

## 2018-12-14 MED ORDER — METOPROLOL TARTRATE 25 MG PO TABS: 12.5000 mg | ORAL_TABLET | Freq: Two times a day (BID) | ORAL | 0 refills | Status: AC

## 2018-12-14 NOTE — ED Notes (Signed)
Patient verbalizes understanding of discharge instructions. Opportunity for questioning and answers were provided. Armband removed by staff, pt discharged from ED.  

## 2018-12-14 NOTE — Discharge Instructions (Signed)
Please read and follow all provided instructions.  Your diagnoses today include:  1. Atrial fibrillation with RVR (Cheryl Graves)     Tests performed today include:  An EKG of your heart  A chest x-ray  Cardiac enzymes - a blood test for heart muscle damage  Blood counts and electrolytes  Thyroid test  Vital signs. See below for your results today.   Medications prescribed:   Metoprolol - medication that lowers her blood pressure and controls heart rate  Take any prescribed medications only as directed.  Follow-up instructions:  Please follow-up with the atrial fibrillation clinic this week for further management.  Please take the metoprolol twice a day as prescribed.  This will help keep your heart rate under control.  You need to continue taking 81 mg aspirin daily.  At some point, with the atrial fibrillation provider or your doctor, you need to determine if you want to be on a medicine that will thin your blood out more.  The risks and benefits to this and we advise you to follow-up with your doctors regarding this.  Return instructions:  SEEK IMMEDIATE MEDICAL ATTENTION IF:  You have severe chest pain, especially if the pain is crushing or pressure-like and spreads to the arms, back, neck, or jaw, or if you have sweating, nausea (feeling sick to your stomach), or shortness of breath. THIS IS AN EMERGENCY. Don't wait to see if the pain will go away. Get medical help at once. Call 911 or 0 (operator). DO NOT drive yourself to the hospital.   Your chest pain gets worse and does not go away with rest.   You have an attack of chest pain lasting longer than usual, despite rest and treatment with the medications your caregiver has prescribed.   You wake from sleep with chest pain or shortness of breath.  You feel dizzy or faint.  You have chest pain not typical of your usual pain for which you originally saw your caregiver.   You have any other emergent concerns regarding your  health.   Your vital signs today were: BP (!) 148/94    Pulse 91    Temp 97.8 F (36.6 C) (Oral)    Resp (!) 25    Ht 5\' 4"  (1.626 m)    Wt 68.9 kg    SpO2 97%    BMI 26.09 kg/m  If your blood pressure (BP) was elevated above 135/85 this visit, please have this repeated by your doctor within one month. --------------

## 2018-12-14 NOTE — ED Triage Notes (Signed)
Pt arrived via [facility] from [place] for new onset A-fib w/RVR. Pt HR got up to 140. Pt denies chest pain, pt currently being tx for pneumonia and does have SOB. Pt is A&Ox4. Pt is A-fib on monitor. Besides HR, VS WNL.

## 2018-12-14 NOTE — ED Provider Notes (Signed)
[facility] Provider Note   CSN: [id number] Arrival date & time: 12/14/18  1839     History   Chief Complaint Chief Complaint  Patient presents with  . Atrial Fibrillation    HPI Cheryl Graves is a 85 y.o. female.     Patient with history of diabetes, hypertension (lisinopril, hydralazine), no anticoagulation [facility] of atrial fibrillation with heart rates 100-140.  Patient was hospitalized earlier in the month for UTI as well as atypical pneumonia.  Patient has completed therapy for this.  She has had some shortness of breath with her pneumonia that is unchanged.  She is otherwise asymptomatic.  She denies chest pain, palpitations, nausea or vomiting.  No lower extremity swelling.  No diarrhea.  No treatments prior to arrival.  This patients CHA2DS2-VASc Score and unadjusted Ischemic Stroke Rate (% per year) is equal to 7.2 % stroke rate/year from a score of 5.        Past Medical History:  Diagnosis Date  . Diabetes mellitus without complication (HCC)   . Diverticulitis   . Hypertension     Patient Active Problem List   Diagnosis Date Noted  . Hyponatremia 11/14/2018  . Type 2 diabetes mellitus (HCC) 11/14/2018  . Physical deconditioning 11/14/2018  . Hypertensive urgency 11/14/2018  . UTI (urinary tract infection) 11/13/2018    Past Surgical History:  Procedure Laterality Date  . APPENDECTOMY       OB History   No obstetric history on file.      Home Medications    Prior to Admission medications   Medication Sig Start Date End Date Taking? Authorizing Provider  acetaminophen (TYLENOL) 325 MG tablet Take 650 mg by mouth every 6 (six) hours as needed for moderate pain.    [provider]  aspirin EC 81 MG tablet Take 81 mg by mouth at bedtime.    [provider]  famotidine (PEPCID) 20 MG tablet Take 20 mg by mouth 2 (two)  times daily.    [provider]  hydrALAZINE (APRESOLINE) 50 MG tablet Take 1 tablet (50 mg total) by mouth 3 (three) times daily. 11/18/18   Narda BondsNettey, Ralph A, MD  lisinopril (ZESTRIL) 40 MG tablet Take 1 tablet (40 mg total) by mouth daily. 11/19/18   Narda BondsNettey, Ralph A, MD  mirabegron ER (MYRBETRIQ) 25 MG TB24 tablet Take 1 tablet (25 mg total) by mouth daily. 11/18/18   Narda BondsNettey, Ralph A, MD  ondansetron (ZOFRAN) 4 MG tablet Take 4 mg by mouth every 8 (eight) hours as needed for nausea or vomiting.    [provider]    Family History No family history on file.  Social History Social History   Tobacco Use  . Smoking status: Current Every Day Smoker  . Smokeless tobacco: Never Used  Substance Use Topics  . Alcohol use: No  . Drug use: No     Allergies   Ciprofloxacin, Metronidazole, and Septra [sulfamethoxazole-trimethoprim]   Review of Systems Review of Systems  Constitutional: Negative for diaphoresis and fever.  HENT: Negative for rhinorrhea and sore throat.   Eyes: Negative for redness.  Respiratory: Positive for shortness of breath. Negative for cough.   Cardiovascular: Negative for chest pain, palpitations and leg swelling.  Gastrointestinal: Negative for abdominal pain, diarrhea, nausea and vomiting.  Genitourinary: Negative for dysuria.  Musculoskeletal: Negative for back pain, myalgias and neck pain.  Skin: Negative for rash.  Neurological:  Negative for syncope, light-headedness and headaches.  Psychiatric/Behavioral: The patient is not nervous/anxious.      Physical Exam Updated Vital Signs BP (!) 147/65 (BP Location: Right Arm)   Pulse (!) 103   Temp 97.8 F (36.6 C) (Oral)   Resp (!) 25   Ht 5\' 4"  (1.626 m)   Wt 68.9 kg   SpO2 98%   BMI 26.09 kg/m   Physical Exam Vitals signs and nursing note reviewed.  Constitutional:      Appearance: She is well-developed. She is not diaphoretic.  HENT:     Head: Normocephalic and atraumatic.      Mouth/Throat:     Mouth: Mucous membranes are not dry.  Eyes:     Conjunctiva/sclera: Conjunctivae normal.  Neck:     Musculoskeletal: Normal range of motion and neck supple. No muscular tenderness.     Vascular: Normal carotid pulses. No carotid bruit or JVD.     Trachea: Trachea normal. No tracheal deviation.  Cardiovascular:     Rate and Rhythm: Tachycardia present. Rhythm irregular.     Pulses: No decreased pulses.     Heart sounds: Normal heart sounds, S1 normal and S2 normal. No murmur.     Comments: 90-110 irregular Pulmonary:     Effort: Pulmonary effort is normal. No respiratory distress.     Breath sounds: No wheezing.  Chest:     Chest wall: No tenderness.  Abdominal:     General: Bowel sounds are normal.     Palpations: Abdomen is soft.     Tenderness: There is no abdominal tenderness. There is no guarding or rebound.  Musculoskeletal: Normal range of motion.  Skin:    General: Skin is warm and dry.     Coloration: Skin is not pale.  Neurological:     Mental Status: She is alert.      ED Treatments / Results  Labs (all labs ordered are listed, but only abnormal results are displayed) Labs Reviewed  BASIC METABOLIC PANEL - Abnormal; Notable for the following components:      Result Value   Sodium 127 (*)    Chloride 95 (*)    CO2 20 (*)    Glucose, Bld 112 (*)    BUN 25 (*)    Creatinine, Ser 1.59 (*)    Calcium 7.9 (*)    GFR calc non Af Amer 29 (*)    GFR calc Af Amer 33 (*)    All other components within normal limits  CBC - Abnormal; Notable for the following components:   RBC 3.50 (*)    Hemoglobin 10.7 (*)    HCT 32.5 (*)    Platelets 427 (*)    All other components within normal limits  PROTIME-INR  TSH  MAGNESIUM    EKG None  Radiology Dg Chest 2 View  Result Date: 12/14/2018 CLINICAL DATA:  Atrial fibrillation and possible pneumonia, history of tobacco use EXAM: CHEST - 2 VIEW COMPARISON:  08/23/2012 FINDINGS: Cardiac shadow is  mildly enlarged. Aortic calcifications are seen. Bibasilar atelectatic/infiltrative changes are noted with small pleural effusions. Degenerative changes of the thoracic spine are noted. IMPRESSION: Bibasilar atelectasis/infiltrate with associated small effusions. Electronically Signed   By: 10/24/2012 M.D.   On: 12/14/2018 20:02    Procedures Procedures (including critical care time)  Medications Ordered in ED Medications  metoprolol tartrate (LOPRESSOR) tablet 12.5 mg (12.5 mg Oral Given 12/14/18 1903)     Initial Impression / Assessment and Plan / ED  Course  I have reviewed the triage vital signs and the nursing notes.  Pertinent labs & imaging results that were available during my care of the patient were reviewed by me and considered in my medical decision making (see chart for details).        Patient seen and examined.  Patient with asymptomatic A. fib.  Heart rate 90-110 here in the emergency department.  Reportedly as high as 140 at her facility.  Patient with normal mentation, no hypotension.  Will give 12.5 mg metoprolol PO while work-up in progress.  Patient appears well.  Vital signs reviewed and are as follows: BP (!) 147/65 (BP Location: Right Arm)   Pulse (!) 103   Temp 97.8 F (36.6 C) (Oral)   Resp (!) 25   Ht 5\' 4"  (1.626 m)   Wt 68.9 kg   SpO2 98%   BMI 26.09 kg/m   Patient in rate controlled A. fib here.  Lab work-up without any acute problems.  Patient discussed with and seen by Dr. Johnney Killian.  Current plan is to have patient follow-up with A. fib clinic, take 12.5 mg of metoprolol twice daily to help control rate, continue aspirin for now and discuss risks and benefits of anticoagulation with the doctors who know her best.  We will discharge to home.  ED ECG REPORT   Date: 12/14/2018  Rate: 93  Rhythm: atrial fibrillation  QRS Axis: normal  Intervals: prolonged QT  ST/T Wave abnormalities: nonspecific ST changes  Conduction Disturbances:none   Narrative Interpretation:   Old EKG Reviewed: unchanged  I have personally reviewed the EKG tracing and agree with the computerized printout as noted.   Final Clinical Impressions(s) / ED Diagnoses   Final diagnoses:  Atrial fibrillation with RVR ([place])   Patient with rate controlled A. fib upon arrival to the emergency department.  She is really asymptomatic as far as the A. fib is concerned.  She is doing well on just some oral Lopressor at this time.  Blood pressures remain slightly elevated.  At this point, do not feel that hospitalization is indicated.  Patient can follow-up with atrial fibrillation clinic and her doctor as an outpatient.  She has an elevated chads vas score however given her deconditioning and fall risk, will defer anticoagulation decision to her doctor.  She will continue aspirin.  ED Discharge Orders         Ordered    Amb referral to AFIB Clinic     12/14/18 1849    metoprolol tartrate (LOPRESSOR) 25 MG tablet  2 times daily     12/14/18 2219           Carlisle Cater, PA-C 12/14/18 2226    Charlesetta Shanks, MD 12/15/18 [phone]

## 2018-12-21 ENCOUNTER — Other Ambulatory Visit: Payer: Self-pay

## 2018-12-21 ENCOUNTER — Ambulatory Visit (HOSPITAL_COMMUNITY)
Admission: RE | Admit: 2018-12-21 | Discharge: 2018-12-21 | Disposition: A | Discharge status: Home / Self Care | Payer: No Typology Code available for payment source | Source: Ambulatory Visit | Attending: Nurse Practitioner | Admitting: Nurse Practitioner

## 2018-12-21 ENCOUNTER — Encounter (HOSPITAL_COMMUNITY): Payer: Self-pay | Admitting: Nurse Practitioner

## 2018-12-21 VITALS — BP 114/60 | HR 96 | Ht 64.0 in | Wt 161.8 lb

## 2018-12-21 DIAGNOSIS — E119 Type 2 diabetes mellitus without complications: Secondary | ICD-10-CM | POA: Insufficient documentation

## 2018-12-21 DIAGNOSIS — I4891 Unspecified atrial fibrillation: Secondary | ICD-10-CM | POA: Insufficient documentation

## 2018-12-21 DIAGNOSIS — Z79899 Other long term (current) drug therapy: Secondary | ICD-10-CM | POA: Insufficient documentation

## 2018-12-21 DIAGNOSIS — I10 Essential (primary) hypertension: Secondary | ICD-10-CM | POA: Diagnosis not present

## 2018-12-21 DIAGNOSIS — Z7901 Long term (current) use of anticoagulants: Secondary | ICD-10-CM | POA: Diagnosis not present

## 2018-12-21 DIAGNOSIS — Z87891 Personal history of nicotine dependence: Secondary | ICD-10-CM | POA: Insufficient documentation

## 2018-12-21 DIAGNOSIS — E871 Hypo-osmolality and hyponatremia: Secondary | ICD-10-CM | POA: Insufficient documentation

## 2018-12-21 MED ORDER — APIXABAN 2.5 MG PO TABS: 2.5000 mg | ORAL_TABLET | Freq: Two times a day (BID) | ORAL | 6 refills | Status: AC

## 2018-12-21 NOTE — Patient Instructions (Signed)
STOP aspirin   START Eliquis 2.5mg  twice a day

## 2018-12-21 NOTE — Progress Notes (Addendum)
Primary Care Physician: [place]    Sable FeilLinda F Graves is a 85 y.o. female with a h/o T2DM, hyponatremia, recent past pneumonia and current treatment of UTI. Pt was just seen in the ER for HR's in the 100-140's reported at nursing facility. She  was in new onset afib. Was already on metoprolol. They deferred starting anticoagulation with a CHA2DS2VASc score of at least 5.   She is in afib clinic today with her health care power of attorney, her niece, Pricilla RiffleJudy Johnson. She  is asymptomatic with afib, rate controlled. Her V/s from the nursing facility show BP's 102 systolic to 135 systolic with an  average around 116. Her HR is mostly in the 70's/80's with highest rate of 94 bpm.   I discussed her stroke risk and the pt and her niece were clear that she wanted to do what she could to avoid stroke. They both deny active falls. She is fairly sedentary. She just went to the nursing facility a month ago after developing pneumonia and could not take care of herself as she lived alone. She will be staying there as her permanent residence. She denies a bleeding historyafib at .   Today, she denies symptoms of palpitations, chest pain, shortness of breath, orthopnea, PND, lower extremity edema, dizziness, presyncope, syncope, or neurologic sequela. The patient is tolerating medications without difficulties and is otherwise without complaint today.   Past Medical History:  Diagnosis Date  . Diabetes mellitus without complication (HCC)   . Diverticulitis   . Hypertension    Past Surgical History:  Procedure Laterality Date  . APPENDECTOMY    . TONSILLECTOMY      Current Outpatient Medications  Medication Sig Dispense Refill  . acetaminophen (TYLENOL) 325 MG tablet Take 650 mg by mouth every 6 (six) hours as needed for moderate pain.    . famotidine (PEPCID) 20 MG tablet Take 20 mg by mouth 2 (two) times daily.    . ferrous sulfate 325  (65 FE) MG tablet Take 325 mg by mouth daily.    . hydrALAZINE (APRESOLINE) 50 MG tablet Take 1 tablet (50 mg total) by mouth 3 (three) times daily.    Marland Kitchen. lisinopril (ZESTRIL) 40 MG tablet Take 1 tablet (40 mg total) by mouth daily.    . metoprolol tartrate (LOPRESSOR) 25 MG tablet Take 0.5 tablets (12.5 mg total) by mouth 2 (two) times daily. 30 tablet 0  . mirabegron ER (MYRBETRIQ) 25 MG TB24 tablet Take 1 tablet (25 mg total) by mouth daily.    . ondansetron (ZOFRAN) 4 MG tablet Take 4 mg by mouth every 8 (eight) hours as needed for nausea or vomiting.    . pantoprazole (PROTONIX) 40 MG tablet Take 40 mg by mouth 2 (two) times daily.    Marland Kitchen. apixaban (ELIQUIS) 2.5 MG TABS tablet Take 1 tablet (2.5 mg total) by mouth 2 (two) times daily. 60 tablet 6   No current facility-administered medications for this encounter.     Allergies  Allergen Reactions  . Ciprofloxacin Diarrhea and Nausea Only  . Metronidazole Diarrhea and Nausea Only  . Septra [Sulfamethoxazole-Trimethoprim] Diarrhea    Social History   Socioeconomic History  . Marital status: Single    Spouse name: Not on file  . Number of children: Not on file  . Years of education: Not on file  . Highest education level: Not on file  Occupational History  . Not on file  Social  Needs  . Financial resource strain: Not on file  . Food insecurity    Worry: Not on file    Inability: Not on file  . Transportation needs    Medical: Not on file    Non-medical: Not on file  Tobacco Use  . Smoking status: Former Smoker    Quit date: 11/20/2018    Years since quitting: 0.0  . Smokeless tobacco: Never Used  Substance and Sexual Activity  . Alcohol use: No  . Drug use: No  . Sexual activity: Not on file  Lifestyle  . Physical activity    Days per week: Not on file    Minutes per session: Not on file  . Stress: Not on file  Relationships  . Social Herbalist on phone: Not on file    Gets together: Not on file     Attends religious service: Not on file    Active member of club or organization: Not on file    Attends meetings of clubs or organizations: Not on file    Relationship status: Not on file  . Intimate partner violence    Fear of current or ex partner: Not on file    Emotionally abused: Not on file    Physically abused: Not on file    Forced sexual activity: Not on file  Other Topics Concern  . Not on file  Social History Narrative  . Not on file    No family history on file.  ROS- All systems are reviewed and negative except as per the HPI above  Physical Exam: Vitals:   12/21/18 0908  BP: 114/60  Pulse: 96  Weight: 73.4 kg  Height: 5\' 4"  (1.626 m)   Wt Readings from Last 3 Encounters:  12/21/18 73.4 kg  12/14/18 68.9 kg  11/18/18 66 kg    Labs: Lab Results  Component Value Date   NA 127 (L) 12/14/2018   K 4.3 12/14/2018   CL 95 (L) 12/14/2018   CO2 20 (L) 12/14/2018   GLUCOSE 112 (H) 12/14/2018   BUN 25 (H) 12/14/2018   CREATININE 1.59 (H) 12/14/2018   CALCIUM 7.9 (L) 12/14/2018   MG 1.8 12/14/2018   Lab Results  Component Value Date   INR 1.0 12/14/2018   No results found for: CHOL, HDL, LDLCALC, TRIG   GEN- The patient is well appearing, alert and oriented x 3 today.   Head- normocephalic, atraumatic Eyes-  Sclera clear, conjunctiva pink Ears- hearing intact Oropharynx- clear Neck- supple, no JVP Lymph- no cervical lymphadenopathy Lungs- Clear to ausculation bilaterally, normal work of breathing Heart- Regular rate and rhythm, no murmurs, rubs or gallops, PMI not laterally displaced GI- soft, NT, ND, + BS Extremities- no clubbing, cyanosis, or edema MS- no significant deformity or atrophy Skin- no rash or lesion Psych- euthymic mood, full affect Neuro- strength and sensation are intact  EKG-afib at 96 bpm, qrs int 90 ms, qtc 459 ms    Assessment and Plan: 1. New onset afib  General education re afib  Pt asymptomatic  She has reasonable  rate control  Continue metoprolol 12.5 mg bid  At this point, rate control may be her goal  2. HTN Stable  3. CHA2DS2VASc score of  5 Denies bleeding history Is clear that she does not want to have a stroke  Will start eliquis 2.5 mg bid based on age and last creatinine  Stop asa   Will see pt back in 3 weeks for  further assessment of afib and adjustment to anticoagualtion Will need repeat CBC/bmet    Lupita Leash C. Matthew Folks [facility] [address] [place], [place] [address] [phone]

## 2019-01-06 ENCOUNTER — Encounter: Payer: Self-pay | Admitting: Pulmonary Disease

## 2019-01-06 ENCOUNTER — Ambulatory Visit (INDEPENDENT_AMBULATORY_CARE_PROVIDER_SITE_OTHER): Payer: Medicare Other | Admitting: Pulmonary Disease

## 2019-01-06 ENCOUNTER — Other Ambulatory Visit: Payer: Self-pay

## 2019-01-06 VITALS — BP 120/68 | HR 66 | Temp 97.3°F | Wt 170.0 lb

## 2019-01-06 DIAGNOSIS — J432 Centrilobular emphysema: Secondary | ICD-10-CM | POA: Diagnosis not present

## 2019-01-06 DIAGNOSIS — J9611 Chronic respiratory failure with hypoxia: Secondary | ICD-10-CM | POA: Diagnosis not present

## 2019-01-06 DIAGNOSIS — I272 Pulmonary hypertension, unspecified: Secondary | ICD-10-CM | POA: Diagnosis not present

## 2019-01-06 DIAGNOSIS — R29818 Other symptoms and signs involving the nervous system: Secondary | ICD-10-CM

## 2019-01-06 MED ORDER — SPIRIVA RESPIMAT 2.5 MCG/ACT IN AERS: 2.0000 | INHALATION_SPRAY | Freq: Every day | RESPIRATORY_TRACT | 5 refills | Status: DC

## 2019-01-06 NOTE — Progress Notes (Signed)
Review of Systems  HENT: Positive for hearing loss.   Respiratory: Positive for cough, sputum production, shortness of breath and wheezing.   Cardiovascular: Positive for leg swelling.  Musculoskeletal: Positive for joint pain.

## 2019-01-06 NOTE — Progress Notes (Signed)
[facility]  Chief Complaint  Patient presents with  . Consult    sleep consult    Constitutional:  BP 120/68 (BP Location: Left Arm, Cuff Size: Normal)   Pulse 66   Temp (!) 97.3 F (36.3 C) (Temporal)   Wt 170 lb (77.1 kg)   SpO2 100%   BMI 29.18 kg/m   Past Medical History:  DM, Hyponatremia, A fib, Diverticulitus  Brief Summary:  Cheryl Graves is a 85 y.o. female former smoker  She was in hospital for new onset a fib in October 2020.  Prior to this she was treated for UTI and hyponatremia.  She also reports having pneumonia recently.  She had Echo done that showed elevated Rt sided pressures with concern for pulmonary hypertension.  She had CT imaging from 2012 and 2014 (reviewed by me) that showed centrilobular emphysema and mild basilar fibrosis.  She smoked for years.  She gets cough, wheeze, and chest congest.  She has trouble sleeping laying flat.  She was started on 2 liters oxygen after hospital stay in October.  She has been using nebulizer daily, and this helps.  She has persistent leg swelling.  She was to have Rt TKR, but this was delayed due to pandemic and recent health issues.   Physical Exam:   Appearance - wearing oxygen  ENMT - clear nasal mucosa, midline nasal  septum, no oral exudates, no LAN, trachea midline  Respiratory - normal chest wall, normal respiratory effort, no accessory muscle use, no wheeze/rales  CV - s1s2 regular rate and rhythm, no murmurs, 1+ peripheral edema, radial pulses symmetric  GI - soft, non tender, no masses  Lymph - no adenopathy noted in neck and axillary areas  MSK - sitting in wheelchair  Ext - no cyanosis, clubbing, or joint inflammation noted  Skin - no rashes, lesions, or ulcers  Neuro - normal strength, oriented x 3  Psych - normal mood and affect  Discussion:  She has elevated right sided pressures on recent Echo.  She has extensive history of smoking and prior  imaging studies showing emphysema.  She was recently found to have atrial fibrillation.  She was also started on supplemental oxygen.  She has some symptoms suggestive of sleep apnea also.  Assessment/Plan:   Centrilobular emphysema. - will add spiriva respimat two puffs daily  - continue duoneb prn - will schedule spirometry with pre/post BD   Chronic respiratory failure with hypoxia. - continue 2 liters oxygen 24/7  Suspected obstructive sleep apnea. - will arrange for ONO with 2 liters and then determine if she needs to have in lab sleep study  Pulmonary hypertension. - likely WHO group 2 and 3 - optimize secondary causes of this   Patient Instructions  Spirometry respimat 2 puffs daily Will schedule overnight oxygen test with 2 liters oxygen Will schedule spirometry with pre/post bronchodilator  Follow up in 4 weeks     Chesley Mires, MD [place] Pager: [phone] 01/06/2019, 9:43 AM  Flow Sheet     Pulmonary tests:    Chest imaging:  CT chest 02/14/11 >> mild emphysema and subpleural fibrosis lower lobes b/l CT chest 08/23/12 >> no change  Sleep tests:    Cardiac tests:  Echo 12/16/18 >> EF 76%, mod elevated RVSP   Review of Systems:  Knee pain.  Remainder reviewed and negative.  Medications:   Allergies as of 01/06/2019      Reactions   Ciprofloxacin Diarrhea,  Nausea Only   Metronidazole Diarrhea, Nausea Only   Septra [sulfamethoxazole-trimethoprim] Diarrhea      Medication List       Accurate as of January 06, 2019  9:43 AM. If you have any questions, ask your nurse or doctor.        acetaminophen 325 MG tablet Commonly known as: TYLENOL Take 650 mg by mouth every 6 (six) hours as needed for moderate pain.   apixaban 2.5 MG Tabs tablet Commonly known as: Eliquis Take 1 tablet (2.5 mg total) by mouth 2 (two) times daily.   famotidine 20 MG tablet Commonly known as: PEPCID Take 20 mg by mouth 2 (two) times  daily.   ferrous sulfate 325 (65 FE) MG tablet Take 325 mg by mouth daily.   hydrALAZINE 50 MG tablet Commonly known as: APRESOLINE Take 1 tablet (50 mg total) by mouth 3 (three) times daily.   ipratropium-albuterol 0.5-2.5 (3) MG/3ML Soln Commonly known as: DUONEB Take 3 mLs by nebulization every 6 (six) hours as needed.   lisinopril 40 MG tablet Commonly known as: ZESTRIL Take 1 tablet (40 mg total) by mouth daily.   metoprolol tartrate 25 MG tablet Commonly known as: LOPRESSOR Take 0.5 tablets (12.5 mg total) by mouth 2 (two) times daily.   mirabegron ER 25 MG Tb24 tablet Commonly known as: MYRBETRIQ Take 1 tablet (25 mg total) by mouth daily.   ondansetron 4 MG tablet Commonly known as: ZOFRAN Take 4 mg by mouth every 8 (eight) hours as needed for nausea or vomiting.   pantoprazole 40 MG tablet Commonly known as: PROTONIX Take 40 mg by mouth 2 (two) times daily.   Spiriva Respimat 2.5 MCG/ACT Aers Generic drug: Tiotropium Bromide Monohydrate Inhale 2 puffs into the lungs daily. Started by: Coralyn Helling, MD   traZODone 50 MG tablet Commonly known as: DESYREL Take 50 mg by mouth at bedtime.       Past Surgical History:  She  has a past surgical history that includes Appendectomy and Tonsillectomy.  Family History:  Her family history is not on file.  Social History:  She  reports that she quit smoking about 6 weeks ago. She has never used smokeless tobacco. She reports that she does not drink alcohol or use drugs.

## 2019-01-06 NOTE — Patient Instructions (Signed)
Spirometry respimat 2 puffs daily Will schedule overnight oxygen test with 2 liters oxygen Will schedule spirometry with pre/post bronchodilator  Follow up in 4 weeks

## 2019-01-10 ENCOUNTER — Other Ambulatory Visit: Payer: Self-pay

## 2019-01-10 ENCOUNTER — Ambulatory Visit (HOSPITAL_COMMUNITY)
Admission: RE | Admit: 2019-01-10 | Discharge: 2019-01-10 | Disposition: A | Discharge status: Home / Self Care | Payer: No Typology Code available for payment source | Source: Ambulatory Visit | Attending: Nurse Practitioner | Admitting: Nurse Practitioner

## 2019-01-10 ENCOUNTER — Encounter (HOSPITAL_COMMUNITY): Payer: Self-pay | Admitting: Nurse Practitioner

## 2019-01-10 VITALS — BP 142/70 | HR 96 | Ht 64.0 in | Wt 159.4 lb

## 2019-01-10 DIAGNOSIS — Z7901 Long term (current) use of anticoagulants: Secondary | ICD-10-CM | POA: Insufficient documentation

## 2019-01-10 DIAGNOSIS — Z79899 Other long term (current) drug therapy: Secondary | ICD-10-CM | POA: Diagnosis not present

## 2019-01-10 DIAGNOSIS — D6869 Other thrombophilia: Secondary | ICD-10-CM

## 2019-01-10 DIAGNOSIS — E871 Hypo-osmolality and hyponatremia: Secondary | ICD-10-CM | POA: Diagnosis not present

## 2019-01-10 DIAGNOSIS — Z87891 Personal history of nicotine dependence: Secondary | ICD-10-CM | POA: Insufficient documentation

## 2019-01-10 DIAGNOSIS — I4891 Unspecified atrial fibrillation: Secondary | ICD-10-CM | POA: Diagnosis present

## 2019-01-10 DIAGNOSIS — I10 Essential (primary) hypertension: Secondary | ICD-10-CM | POA: Diagnosis not present

## 2019-01-10 DIAGNOSIS — R9431 Abnormal electrocardiogram [ECG] [EKG]: Secondary | ICD-10-CM | POA: Insufficient documentation

## 2019-01-10 DIAGNOSIS — Z881 Allergy status to other antibiotic agents status: Secondary | ICD-10-CM | POA: Insufficient documentation

## 2019-01-10 DIAGNOSIS — Z882 Allergy status to sulfonamides status: Secondary | ICD-10-CM | POA: Diagnosis not present

## 2019-01-10 LAB — BASIC METABOLIC PANEL
Anion gap: 8 (ref 5–15)
BUN: 25 mg/dL — ABNORMAL HIGH (ref 8–23)
CO2: 25 mmol/L (ref 22–32)
Calcium: 8.5 mg/dL — ABNORMAL LOW (ref 8.9–10.3)
Chloride: 103 mmol/L (ref 98–111)
Creatinine, Ser: 1.36 mg/dL — ABNORMAL HIGH (ref 0.44–1.00)
GFR calc Af Amer: 40 mL/min — ABNORMAL LOW (ref >60)
GFR calc non Af Amer: 35 mL/min — ABNORMAL LOW (ref >60)
Glucose, Bld: 128 mg/dL — ABNORMAL HIGH (ref 70–99)
Potassium: 4.3 mmol/L (ref 3.5–5.1)
Sodium: 136 mmol/L (ref 135–145)

## 2019-01-10 LAB — CBC
HCT: 36.1 % (ref 36.0–46.0)
Hemoglobin: 11.3 g/dL — ABNORMAL LOW (ref 12.0–15.0)
MCH: 29.8 pg (ref 26.0–34.0)
MCHC: 31.3 g/dL (ref 30.0–36.0)
MCV: 95.3 fL (ref 80.0–100.0)
Platelets: 367 10*3/uL (ref 150–400)
RBC: 3.79 MIL/uL — ABNORMAL LOW (ref 3.87–5.11)
RDW: 13.7 % (ref 11.5–15.5)
WBC: 7.3 10*3/uL (ref 4.0–10.5)
nRBC: 0 % (ref 0.0–0.2)

## 2019-01-10 NOTE — Progress Notes (Signed)
Primary Care Physician: [name] Referring Physician: [facility] ER f/u    Cheryl Graves is a 85 y.o. female with a h/o T2DM, hyponatremia, recent past pneumonia and current treatment of UTI. Pt was just seen in the ER for HR's in the 100-140's reported at nursing facility. She  was in new onset afib. Was already on metoprolol. They deferred starting anticoagulation with a CHA2DS2VASc score of at least 5.   She is in afib clinic today with her health care power of attorney, her niece, Pricilla Riffle. She  is asymptomatic with afib, rate controlled. Her V/S  from the nursing facility show BP's 102 systolic to 135 systolic with an  average around 116. Her HR is mostly in the 70's/80's with highest rate of 94 bpm.   I discussed her stroke risk and the pt and her niece were clear that she wanted to do what she could to avoid stroke. They both deny active falls. She is fairly sedentary. She just went to the nursing facility a month ago after developing pneumonia and could not take care of herself as she lived alone. She will be staying there as her permanent residence. She denies a bleeding history.  F/u in afib clinic, 11/23. She is reasonably rate controlled. She does not feel the afib. She is doing well on anticoagulation, no reported bleeding issues.   Today, she denies symptoms of palpitations, chest pain, shortness of breath, orthopnea, PND, lower extremity edema, dizziness, presyncope, syncope, or neurologic sequela. The patient is tolerating medications without difficulties and is otherwise without complaint today.   Past Medical History:  Diagnosis Date  . Diabetes mellitus without complication (HCC)   . Diverticulitis   . Hypertension    Past Surgical History:  Procedure Laterality Date  . APPENDECTOMY    . TONSILLECTOMY      Current Outpatient Medications  Medication Sig Dispense Refill  . acetaminophen (TYLENOL) 325 MG tablet Take 650 mg by mouth  every 6 (six) hours as needed for moderate pain.    Marland Kitchen apixaban (ELIQUIS) 2.5 MG TABS tablet Take 1 tablet (2.5 mg total) by mouth 2 (two) times daily. 60 tablet 6  . dextromethorphan-guaiFENesin (MUCINEX DM) 30-600 MG 12hr tablet Take 1 tablet by mouth 2 (two) times daily.    . famotidine (PEPCID) 20 MG tablet Take 20 mg by mouth 2 (two) times daily.    . ferrous sulfate 325 (65 FE) MG tablet Take 325 mg by mouth daily.    . hydrALAZINE (APRESOLINE) 50 MG tablet Take 1 tablet (50 mg total) by mouth 3 (three) times daily.    Marland Kitchen ipratropium-albuterol (DUONEB) 0.5-2.5 (3) MG/3ML SOLN Take 3 mLs by nebulization every 6 (six) hours as needed.    Marland Kitchen lisinopril (ZESTRIL) 40 MG tablet Take 1 tablet (40 mg total) by mouth daily.    . metoprolol tartrate (LOPRESSOR) 25 MG tablet Take 0.5 tablets (12.5 mg total) by mouth 2 (two) times daily. 30 tablet 0  . mirabegron ER (MYRBETRIQ) 25 MG TB24 tablet Take 1 tablet (25 mg total) by mouth daily.    . ondansetron (ZOFRAN) 4 MG tablet Take 4 mg by mouth every 8 (eight) hours as needed for nausea or vomiting.    . pantoprazole (PROTONIX) 40 MG tablet Take 40 mg by mouth 2 (two) times daily.    . sodium chloride 1 g tablet Take 1 g by mouth 3 (three) times daily.    . Tiotropium Bromide Monohydrate (SPIRIVA RESPIMAT)  2.5 MCG/ACT AERS Inhale 2 puffs into the lungs daily. 4 g 5  . traZODone (DESYREL) 50 MG tablet Take 50 mg by mouth at bedtime.     No current facility-administered medications for this encounter.     Allergies  Allergen Reactions  . Ciprofloxacin Diarrhea and Nausea Only  . Metronidazole Diarrhea and Nausea Only  . Septra [Sulfamethoxazole-Trimethoprim] Diarrhea    Social History   Socioeconomic History  . Marital status: Single    Spouse name: Not on file  . Number of children: Not on file  . Years of education: Not on file  . Highest education level: Not on file  Occupational History  . Not on file  Social Needs  . Financial resource  strain: Not on file  . Food insecurity    Worry: Not on file    Inability: Not on file  . Transportation needs    Medical: Not on file    Non-medical: Not on file  Tobacco Use  . Smoking status: Former Smoker    Quit date: 11/20/2018    Years since quitting: 0.1  . Smokeless tobacco: Never Used  Substance and Sexual Activity  . Alcohol use: No  . Drug use: No  . Sexual activity: Not on file  Lifestyle  . Physical activity    Days per week: Not on file    Minutes per session: Not on file  . Stress: Not on file  Relationships  . Social Musicianconnections    Talks on phone: Not on file    Gets together: Not on file    Attends religious service: Not on file    Active member of club or organization: Not on file    Attends meetings of clubs or organizations: Not on file    Relationship status: Not on file  . Intimate partner violence    Fear of current or ex partner: Not on file    Emotionally abused: Not on file    Physically abused: Not on file    Forced sexual activity: Not on file  Other Topics Concern  . Not on file  Social History Narrative  . Not on file    No family history on file.  ROS- All systems are reviewed and negative except as per the HPI above  Physical Exam: Vitals:   01/10/19 1513  BP: (!) 142/70  Pulse: 96  SpO2: 97%  Weight: 72.3 kg  Height: 5\' 4"  (1.626 m)   Wt Readings from Last 3 Encounters:  01/10/19 72.3 kg  01/06/19 77.1 kg  12/21/18 73.4 kg    Labs: Lab Results  Component Value Date   NA 127 (L) 12/14/2018   K 4.3 12/14/2018   CL 95 (L) 12/14/2018   CO2 20 (L) 12/14/2018   GLUCOSE 112 (H) 12/14/2018   BUN 25 (H) 12/14/2018   CREATININE 1.59 (H) 12/14/2018   CALCIUM 7.9 (L) 12/14/2018   MG 1.8 12/14/2018   Lab Results  Component Value Date   INR 1.0 12/14/2018   No results found for: CHOL, HDL, LDLCALC, TRIG   GEN- The patient is well appearing, alert and oriented x 3 today.   Head- normocephalic, atraumatic Eyes-  Sclera  clear, conjunctiva pink Ears- hearing intact Oropharynx- clear Neck- supple, no JVP Lymph- no cervical lymphadenopathy Lungs- Clear to ausculation bilaterally, normal work of breathing Heart- Regular rate and rhythm, no murmurs, rubs or gallops, PMI not laterally displaced GI- soft, NT, ND, + BS Extremities- no clubbing, cyanosis, or edema  MS- no significant deformity or atrophy Skin- no rash or lesion Psych- euthymic mood, full affect Neuro- strength and sensation are intact  EKG-afib at 96 bpm, qrs int 90 ms, qtc 442 ms    Assessment and Plan: 1. New onset afib  Pt asymptomatic  She has reasonable rate control  Continue metoprolol 12.5 mg bid  At this point, rate control is  her goal Discussed cardioversion, she does not want to do anything different with  her treatment   2. HTN Stable  3. CHA2DS2VASc score of  5 Denies bleeding history Is clear that she does not want to have a stroke  Continue eliquis 2.5 mg bid based on age and last creatinine  Off asa  Bmet/cbc today     f/u here in 3 months    Butch Penny C. Jessika Rothery, [facility] [address] [place], [place] [address] [phone]

## 2019-01-11 ENCOUNTER — Ambulatory Visit (HOSPITAL_COMMUNITY): Payer: Medicare Other | Admitting: Nurse Practitioner

## 2019-01-25 ENCOUNTER — Other Ambulatory Visit: Payer: Self-pay

## 2019-01-25 ENCOUNTER — Emergency Department (HOSPITAL_COMMUNITY): Payer: Medicare Other

## 2019-01-25 ENCOUNTER — Inpatient Hospital Stay (HOSPITAL_COMMUNITY): Payer: Medicare Other

## 2019-01-25 ENCOUNTER — Encounter (HOSPITAL_COMMUNITY): Payer: Self-pay | Admitting: Internal Medicine

## 2019-01-25 ENCOUNTER — Inpatient Hospital Stay (HOSPITAL_COMMUNITY)
Admission: EM | Admit: 2019-01-25 | Discharge: 2019-01-30 | DRG: 291 | Disposition: A | Discharge status: Hospice | Payer: Medicare Other | Source: Skilled Nursing Facility | Attending: Family Medicine | Admitting: Family Medicine

## 2019-01-25 DIAGNOSIS — G460 Middle cerebral artery syndrome: Secondary | ICD-10-CM | POA: Diagnosis not present

## 2019-01-25 DIAGNOSIS — J441 Chronic obstructive pulmonary disease with (acute) exacerbation: Secondary | ICD-10-CM | POA: Diagnosis present

## 2019-01-25 DIAGNOSIS — I509 Heart failure, unspecified: Secondary | ICD-10-CM

## 2019-01-25 DIAGNOSIS — E785 Hyperlipidemia, unspecified: Secondary | ICD-10-CM | POA: Diagnosis present

## 2019-01-25 DIAGNOSIS — I130 Hypertensive heart and chronic kidney disease with heart failure and stage 1 through stage 4 chronic kidney disease, or unspecified chronic kidney disease: Secondary | ICD-10-CM | POA: Diagnosis present

## 2019-01-25 DIAGNOSIS — R4701 Aphasia: Secondary | ICD-10-CM | POA: Diagnosis not present

## 2019-01-25 DIAGNOSIS — G8191 Hemiplegia, unspecified affecting right dominant side: Secondary | ICD-10-CM | POA: Diagnosis not present

## 2019-01-25 DIAGNOSIS — Z87891 Personal history of nicotine dependence: Secondary | ICD-10-CM | POA: Diagnosis not present

## 2019-01-25 DIAGNOSIS — Z79899 Other long term (current) drug therapy: Secondary | ICD-10-CM | POA: Diagnosis not present

## 2019-01-25 DIAGNOSIS — R29722 NIHSS score 22: Secondary | ICD-10-CM | POA: Diagnosis not present

## 2019-01-25 DIAGNOSIS — R531 Weakness: Secondary | ICD-10-CM | POA: Diagnosis not present

## 2019-01-25 DIAGNOSIS — N1831 Chronic kidney disease, stage 3a: Secondary | ICD-10-CM | POA: Diagnosis present

## 2019-01-25 DIAGNOSIS — Z7189 Other specified counseling: Secondary | ICD-10-CM | POA: Diagnosis not present

## 2019-01-25 DIAGNOSIS — Z515 Encounter for palliative care: Secondary | ICD-10-CM | POA: Diagnosis not present

## 2019-01-25 DIAGNOSIS — J9621 Acute and chronic respiratory failure with hypoxia: Secondary | ICD-10-CM | POA: Diagnosis present

## 2019-01-25 DIAGNOSIS — K219 Gastro-esophageal reflux disease without esophagitis: Secondary | ICD-10-CM | POA: Diagnosis present

## 2019-01-25 DIAGNOSIS — R52 Pain, unspecified: Secondary | ICD-10-CM | POA: Diagnosis not present

## 2019-01-25 DIAGNOSIS — I5031 Acute diastolic (congestive) heart failure: Secondary | ICD-10-CM | POA: Diagnosis present

## 2019-01-25 DIAGNOSIS — E1122 Type 2 diabetes mellitus with diabetic chronic kidney disease: Secondary | ICD-10-CM | POA: Diagnosis present

## 2019-01-25 DIAGNOSIS — R05 Cough: Secondary | ICD-10-CM | POA: Diagnosis not present

## 2019-01-25 DIAGNOSIS — I63412 Cerebral infarction due to embolism of left middle cerebral artery: Secondary | ICD-10-CM | POA: Diagnosis not present

## 2019-01-25 DIAGNOSIS — Z7951 Long term (current) use of inhaled steroids: Secondary | ICD-10-CM

## 2019-01-25 DIAGNOSIS — Z66 Do not resuscitate: Secondary | ICD-10-CM | POA: Diagnosis present

## 2019-01-25 DIAGNOSIS — Z20828 Contact with and (suspected) exposure to other viral communicable diseases: Secondary | ICD-10-CM | POA: Diagnosis present

## 2019-01-25 DIAGNOSIS — R0602 Shortness of breath: Secondary | ICD-10-CM

## 2019-01-25 DIAGNOSIS — R29810 Facial weakness: Secondary | ICD-10-CM | POA: Diagnosis not present

## 2019-01-25 DIAGNOSIS — I340 Nonrheumatic mitral (valve) insufficiency: Secondary | ICD-10-CM | POA: Diagnosis not present

## 2019-01-25 DIAGNOSIS — I482 Chronic atrial fibrillation, unspecified: Secondary | ICD-10-CM | POA: Diagnosis present

## 2019-01-25 DIAGNOSIS — I361 Nonrheumatic tricuspid (valve) insufficiency: Secondary | ICD-10-CM | POA: Diagnosis not present

## 2019-01-25 DIAGNOSIS — R1310 Dysphagia, unspecified: Secondary | ICD-10-CM | POA: Diagnosis not present

## 2019-01-25 DIAGNOSIS — I63419 Cerebral infarction due to embolism of unspecified middle cerebral artery: Secondary | ICD-10-CM | POA: Diagnosis not present

## 2019-01-25 DIAGNOSIS — E871 Hypo-osmolality and hyponatremia: Secondary | ICD-10-CM | POA: Diagnosis present

## 2019-01-25 DIAGNOSIS — Z7901 Long term (current) use of anticoagulants: Secondary | ICD-10-CM | POA: Diagnosis not present

## 2019-01-25 LAB — RESPIRATORY PANEL BY RT PCR (FLU A&B, COVID)
Influenza A by PCR: NEGATIVE
Influenza B by PCR: NEGATIVE
SARS Coronavirus 2 by RT PCR: NEGATIVE

## 2019-01-25 LAB — BASIC METABOLIC PANEL
Anion gap: 9 (ref 5–15)
BUN: 21 mg/dL (ref 8–23)
CO2: 25 mmol/L (ref 22–32)
Calcium: 8.6 mg/dL — ABNORMAL LOW (ref 8.9–10.3)
Chloride: 103 mmol/L (ref 98–111)
Creatinine, Ser: 1.16 mg/dL — ABNORMAL HIGH (ref 0.44–1.00)
GFR calc Af Amer: 49 mL/min — ABNORMAL LOW (ref >60)
GFR calc non Af Amer: 42 mL/min — ABNORMAL LOW (ref >60)
Glucose, Bld: 194 mg/dL — ABNORMAL HIGH (ref 70–99)
Potassium: 5.4 mmol/L — ABNORMAL HIGH (ref 3.5–5.1)
Sodium: 137 mmol/L (ref 135–145)

## 2019-01-25 LAB — CBC WITH DIFFERENTIAL/PLATELET
Abs Immature Granulocytes: 0.03 10*3/uL (ref 0.00–0.07)
Basophils Absolute: 0.1 10*3/uL (ref 0.0–0.1)
Basophils Relative: 1 %
Eosinophils Absolute: 0.1 10*3/uL (ref 0.0–0.5)
Eosinophils Relative: 1 %
HCT: 37.2 % (ref 36.0–46.0)
Hemoglobin: 11.6 g/dL — ABNORMAL LOW (ref 12.0–15.0)
Immature Granulocytes: 0 %
Lymphocytes Relative: 10 %
Lymphs Abs: 0.9 10*3/uL (ref 0.7–4.0)
MCH: 29.4 pg (ref 26.0–34.0)
MCHC: 31.2 g/dL (ref 30.0–36.0)
MCV: 94.2 fL (ref 80.0–100.0)
Monocytes Absolute: 0.5 10*3/uL (ref 0.1–1.0)
Monocytes Relative: 5 %
Neutro Abs: 7.4 10*3/uL (ref 1.7–7.7)
Neutrophils Relative %: 83 %
Platelets: 329 10*3/uL (ref 150–400)
RBC: 3.95 MIL/uL (ref 3.87–5.11)
RDW: 13.9 % (ref 11.5–15.5)
WBC: 9 10*3/uL (ref 4.0–10.5)
nRBC: 0 % (ref 0.0–0.2)

## 2019-01-25 LAB — STREP PNEUMONIAE URINARY ANTIGEN: Strep Pneumo Urinary Antigen: NEGATIVE

## 2019-01-25 LAB — CBC
HCT: 36.8 % (ref 36.0–46.0)
Hemoglobin: 11.4 g/dL — ABNORMAL LOW (ref 12.0–15.0)
MCH: 29.4 pg (ref 26.0–34.0)
MCHC: 31 g/dL (ref 30.0–36.0)
MCV: 94.8 fL (ref 80.0–100.0)
Platelets: 310 10*3/uL (ref 150–400)
RBC: 3.88 MIL/uL (ref 3.87–5.11)
RDW: 13.9 % (ref 11.5–15.5)
WBC: 5.1 10*3/uL (ref 4.0–10.5)
nRBC: 0 % (ref 0.0–0.2)

## 2019-01-25 LAB — CREATININE, SERUM
Creatinine, Ser: 1.24 mg/dL — ABNORMAL HIGH (ref 0.44–1.00)
GFR calc Af Amer: 45 mL/min — ABNORMAL LOW (ref >60)
GFR calc non Af Amer: 39 mL/min — ABNORMAL LOW (ref >60)

## 2019-01-25 LAB — ECHOCARDIOGRAM COMPLETE

## 2019-01-25 LAB — PROCALCITONIN: Procalcitonin: <0.1 ng/mL

## 2019-01-25 LAB — BRAIN NATRIURETIC PEPTIDE: B Natriuretic Peptide: 497.8 pg/mL — ABNORMAL HIGH (ref 0.0–100.0)

## 2019-01-25 LAB — HIV ANTIBODY (ROUTINE TESTING W REFLEX): HIV Screen 4th Generation wRfx: NONREACTIVE

## 2019-01-25 MED ORDER — PANTOPRAZOLE SODIUM 40 MG PO TBEC
40.0000 mg | DELAYED_RELEASE_TABLET | Freq: Two times a day (BID) | ORAL | Status: DC
  Administered 2019-01-25 – 2019-01-30 (×8): 40 mg via ORAL
  Filled 2019-01-25 (×8): qty 1

## 2019-01-25 MED ORDER — HYDRALAZINE HCL 50 MG PO TABS
50.0000 mg | ORAL_TABLET | Freq: Three times a day (TID) | ORAL | Status: DC
  Administered 2019-01-25 – 2019-01-30 (×12): 50 mg via ORAL
  Filled 2019-01-25 (×12): qty 1

## 2019-01-25 MED ORDER — METOPROLOL TARTRATE 12.5 MG HALF TABLET
12.5000 mg | ORAL_TABLET | Freq: Two times a day (BID) | ORAL | Status: DC
  Administered 2019-01-25 – 2019-01-27 (×4): 12.5 mg via ORAL
  Filled 2019-01-25 (×3): qty 1

## 2019-01-25 MED ORDER — MIRABEGRON ER 25 MG PO TB24
25.0000 mg | ORAL_TABLET | Freq: Every day | ORAL | Status: DC
  Administered 2019-01-25 – 2019-01-30 (×5): 25 mg via ORAL
  Filled 2019-01-25 (×6): qty 1

## 2019-01-25 MED ORDER — AZITHROMYCIN 250 MG PO TABS
500.0000 mg | ORAL_TABLET | Freq: Every day | ORAL | Status: AC
  Administered 2019-01-26 – 2019-01-29 (×3): 500 mg via ORAL
  Filled 2019-01-25 (×3): qty 2

## 2019-01-25 MED ORDER — ALBUTEROL SULFATE HFA 108 (90 BASE) MCG/ACT IN AERS
2.0000 | INHALATION_SPRAY | Freq: Once | RESPIRATORY_TRACT | Status: AC
  Administered 2019-01-25: 2 via RESPIRATORY_TRACT
  Filled 2019-01-25: qty 6.7

## 2019-01-25 MED ORDER — TIOTROPIUM BROMIDE MONOHYDRATE 2.5 MCG/ACT IN AERS: 2.0000 | INHALATION_SPRAY | Freq: Every day | RESPIRATORY_TRACT | Status: DC

## 2019-01-25 MED ORDER — FUROSEMIDE 10 MG/ML IJ SOLN
40.0000 mg | Freq: Once | INTRAMUSCULAR | Status: AC
  Administered 2019-01-25: 40 mg via INTRAVENOUS
  Filled 2019-01-25: qty 4

## 2019-01-25 MED ORDER — SODIUM CHLORIDE 0.9 % IV SOLN
1.0000 g | INTRAVENOUS | Status: DC
  Administered 2019-01-25 – 2019-01-29 (×5): 1 g via INTRAVENOUS
  Filled 2019-01-25: qty 1
  Filled 2019-01-25: qty 10
  Filled 2019-01-25: qty 1
  Filled 2019-01-25 (×2): qty 10
  Filled 2019-01-25: qty 1

## 2019-01-25 MED ORDER — DEXTROSE 50 % IV SOLN: 1.0000 | Freq: Once | INTRAVENOUS | Status: DC

## 2019-01-25 MED ORDER — ZINC GLUCONATE 50 MG PO TABS: 50.0000 mg | ORAL_TABLET | Freq: Every day | ORAL | Status: DC

## 2019-01-25 MED ORDER — LISINOPRIL 20 MG PO TABS
40.0000 mg | ORAL_TABLET | Freq: Every day | ORAL | Status: DC
  Administered 2019-01-25 – 2019-01-30 (×5): 40 mg via ORAL
  Filled 2019-01-25 (×5): qty 2

## 2019-01-25 MED ORDER — ALBUTEROL SULFATE (2.5 MG/3ML) 0.083% IN NEBU: 10.0000 mg | INHALATION_SOLUTION | Freq: Once | RESPIRATORY_TRACT | Status: DC

## 2019-01-25 MED ORDER — METHYLPREDNISOLONE SODIUM SUCC 40 MG IJ SOLR
40.0000 mg | INTRAMUSCULAR | Status: DC
  Administered 2019-01-26 – 2019-01-27 (×2): 40 mg via INTRAVENOUS
  Filled 2019-01-25: qty 1

## 2019-01-25 MED ORDER — ACETAMINOPHEN 325 MG PO TABS
650.0000 mg | ORAL_TABLET | Freq: Four times a day (QID) | ORAL | Status: DC | PRN
  Administered 2019-01-30: 650 mg via ORAL
  Filled 2019-01-25: qty 2

## 2019-01-25 MED ORDER — METHYLPREDNISOLONE SODIUM SUCC 125 MG IJ SOLR
125.0000 mg | Freq: Once | INTRAMUSCULAR | Status: AC
  Administered 2019-01-25: 125 mg via INTRAVENOUS
  Filled 2019-01-25: qty 2

## 2019-01-25 MED ORDER — MELATONIN 1 MG PO TABS
2.0000 mg | ORAL_TABLET | Freq: Every day | ORAL | Status: DC
  Administered 2019-01-25 – 2019-01-30 (×4): 2 mg via ORAL
  Filled 2019-01-25 (×7): qty 2

## 2019-01-25 MED ORDER — ALBUTEROL SULFATE (2.5 MG/3ML) 0.083% IN NEBU
2.5000 mg | INHALATION_SOLUTION | RESPIRATORY_TRACT | Status: DC | PRN
  Administered 2019-01-27: 2.5 mg via RESPIRATORY_TRACT
  Filled 2019-01-25: qty 3

## 2019-01-25 MED ORDER — DEXTROMETHORPHAN POLISTIREX ER 30 MG/5ML PO SUER
60.0000 mg | Freq: Two times a day (BID) | ORAL | Status: DC | PRN
  Filled 2019-01-25: qty 10

## 2019-01-25 MED ORDER — APIXABAN 2.5 MG PO TABS
2.5000 mg | ORAL_TABLET | Freq: Two times a day (BID) | ORAL | Status: DC
  Administered 2019-01-25 – 2019-01-27 (×4): 2.5 mg via ORAL
  Filled 2019-01-25 (×4): qty 1

## 2019-01-25 MED ORDER — FUROSEMIDE 10 MG/ML IJ SOLN
40.0000 mg | Freq: Two times a day (BID) | INTRAMUSCULAR | Status: DC
  Administered 2019-01-25 – 2019-01-26 (×2): 40 mg via INTRAVENOUS
  Filled 2019-01-25 (×2): qty 4

## 2019-01-25 MED ORDER — FLUTICASONE FUROATE-VILANTEROL 100-25 MCG/INH IN AEPB
1.0000 | INHALATION_SPRAY | Freq: Every day | RESPIRATORY_TRACT | Status: DC
  Administered 2019-01-25 – 2019-01-27 (×3): 1 via RESPIRATORY_TRACT
  Filled 2019-01-25: qty 28

## 2019-01-25 MED ORDER — CYANOCOBALAMIN 1000 MCG/ML IJ KIT: 1000.0000 ug | PACK | INTRAMUSCULAR | Status: DC

## 2019-01-25 MED ORDER — INSULIN ASPART 100 UNIT/ML IV SOLN: 10.0000 [IU] | Freq: Once | INTRAVENOUS | Status: DC

## 2019-01-25 MED ORDER — SODIUM CHLORIDE 0.9 % IV SOLN
500.0000 mg | INTRAVENOUS | Status: AC
  Administered 2019-01-25: 19:00:00 500 mg via INTRAVENOUS
  Filled 2019-01-25: qty 500

## 2019-01-25 MED ORDER — VITAMIN D3 25 MCG (1000 UNIT) PO TABS
1000.0000 [IU] | ORAL_TABLET | Freq: Every day | ORAL | Status: DC
  Administered 2019-01-26 – 2019-01-30 (×4): 1000 [IU] via ORAL
  Filled 2019-01-25 (×4): qty 1

## 2019-01-25 MED ORDER — VITAMIN C 500 MG PO TABS
1000.0000 mg | ORAL_TABLET | Freq: Every day | ORAL | Status: DC
  Administered 2019-01-26 – 2019-01-30 (×4): 1000 mg via ORAL
  Filled 2019-01-25 (×5): qty 2

## 2019-01-25 MED ORDER — UMECLIDINIUM BROMIDE 62.5 MCG/INH IN AEPB
1.0000 | INHALATION_SPRAY | Freq: Every day | RESPIRATORY_TRACT | Status: DC
  Administered 2019-01-25 – 2019-01-27 (×3): 1 via RESPIRATORY_TRACT
  Filled 2019-01-25: qty 7

## 2019-01-25 MED ORDER — ONDANSETRON HCL 4 MG PO TABS: 4.0000 mg | ORAL_TABLET | Freq: Three times a day (TID) | ORAL | Status: DC | PRN

## 2019-01-25 MED ORDER — SODIUM CHLORIDE 1 G PO TABS
1.0000 g | ORAL_TABLET | Freq: Three times a day (TID) | ORAL | Status: DC
  Administered 2019-01-25 – 2019-01-27 (×5): 1 g via ORAL
  Filled 2019-01-25 (×5): qty 1

## 2019-01-25 MED ORDER — IPRATROPIUM-ALBUTEROL 20-100 MCG/ACT IN AERS: 1.0000 | INHALATION_SPRAY | Freq: Four times a day (QID) | RESPIRATORY_TRACT | Status: DC | PRN

## 2019-01-25 MED ORDER — DM-GUAIFENESIN ER 30-600 MG PO TB12
1.0000 | ORAL_TABLET | Freq: Two times a day (BID) | ORAL | Status: DC | PRN
  Administered 2019-01-30: 1 via ORAL
  Filled 2019-01-25: qty 1

## 2019-01-25 MED ORDER — SODIUM CHLORIDE 0.9 % IV SOLN
INTRAVENOUS | Status: DC
  Administered 2019-01-25: 10 mL/h via INTRAVENOUS
  Administered 2019-01-30: 07:00:00 via INTRAVENOUS

## 2019-01-25 MED ORDER — LORAZEPAM 2 MG/ML IJ SOLN
0.5000 mg | Freq: Once | INTRAMUSCULAR | Status: AC
  Administered 2019-01-25: 0.5 mg via INTRAVENOUS
  Filled 2019-01-25: qty 1

## 2019-01-25 NOTE — ED Provider Notes (Signed)
[facility]-EMERGENCY DEPT Provider Note   CSN: [id number] Arrival date & time: 01/25/19  [id number]     History   Chief Complaint Chief Complaint  Patient presents with  . Shortness of Breath    HPI Cheryl Graves is a 85 y.o. female.     85 year old female presents with cough congestion and shortness of breath.  Has a history of chronic A. fib and is on Eliquis.  Patient denies any chest pain or chest pressure.  No recent fever or chills.  Does have a history of pulmonary disease and used her inhaler without relief.  No leg pain or swelling.  Symptoms are somewhat exertional but are not associate with syncope or near syncope.  No anginal type symptoms.  EMS called and patient transported here     Past Medical History:  Diagnosis Date  . Diabetes mellitus without complication (HCC)   . Diverticulitis   . Hypertension     Patient Active Problem List   Diagnosis Date Noted  . Hyponatremia 11/14/2018  . Type 2 diabetes mellitus (HCC) 11/14/2018  . Physical deconditioning 11/14/2018  . Hypertensive urgency 11/14/2018  . UTI (urinary tract infection) 11/13/2018    Past Surgical History:  Procedure Laterality Date  . APPENDECTOMY    . TONSILLECTOMY       OB History   No obstetric history on file.      Home Medications    Prior to Admission medications   Medication Sig Start Date End Date Taking? Authorizing Provider  acetaminophen (TYLENOL) 325 MG tablet Take 650 mg by mouth every 6 (six) hours as needed for moderate pain.    [provider]  apixaban (ELIQUIS) 2.5 MG TABS tablet Take 1 tablet (2.5 mg total) by mouth 2 (two) times daily. 12/21/18   Newman Niparroll, Donna C, NP  dextromethorphan-guaiFENesin [facility](MUCINEX DM) 30-600 MG 12hr tablet Take 1 tablet by mouth 2 (two) times daily.    [provider]  famotidine (PEPCID) 20 MG tablet Take 20 mg by mouth 2 (two) times daily.    [provider]  ferrous sulfate 325 (65 FE) MG  tablet Take 325 mg by mouth daily.    [provider]  hydrALAZINE (APRESOLINE) 50 MG tablet Take 1 tablet (50 mg total) by mouth 3 (three) times daily. 11/18/18   Narda BondsNettey, Ralph A, MD  ipratropium-albuterol (DUONEB) 0.5-2.5 (3) MG/3ML SOLN Take 3 mLs by nebulization every 6 (six) hours as needed.    [provider]  lisinopril (ZESTRIL) 40 MG tablet Take 1 tablet (40 mg total) by mouth daily. 11/19/18   Narda BondsNettey, Ralph A, MD  metoprolol tartrate (LOPRESSOR) 25 MG tablet Take 0.5 tablets (12.5 mg total) by mouth 2 (two) times daily. 12/14/18   Renne CriglerGeiple, Joshua, PA-C  mirabegron ER (MYRBETRIQ) 25 MG TB24 tablet Take 1 tablet (25 mg total) by mouth daily. 11/18/18   Narda BondsNettey, Ralph A, MD  ondansetron (ZOFRAN) 4 MG tablet Take 4 mg by mouth every 8 (eight) hours as needed for nausea or vomiting.    [provider]  pantoprazole (PROTONIX) 40 MG tablet Take 40 mg by mouth 2 (two) times daily.    [provider]  sodium chloride 1 g tablet Take 1 g by mouth 3 (three) times daily.    [provider]  Tiotropium Bromide Monohydrate (SPIRIVA RESPIMAT) 2.5 MCG/ACT AERS Inhale 2 puffs into the lungs daily. 01/06/19   Coralyn HellingSood, Vineet, MD  traZODone (DESYREL) 50 MG tablet Take 50 mg by  mouth at bedtime.    [provider]    Family History No family history on file.  Social History Social History   Tobacco Use  . Smoking status: Former Smoker    Quit date: 11/20/2018    Years since quitting: 0.1  . Smokeless tobacco: Never Used  Substance Use Topics  . Alcohol use: No  . Drug use: No     Allergies   Ciprofloxacin, Metronidazole, and Septra [sulfamethoxazole-trimethoprim]   Review of Systems Review of Systems  All other systems reviewed and are negative.    Physical Exam Updated Vital Signs BP (!) 161/102   Pulse (!) 112   Temp 97.8 F (36.6 C) (Oral)   Resp (!) 32   SpO2 98%   Physical Exam Vitals signs and nursing note reviewed.   Constitutional:      General: She is not in acute distress.    Appearance: Normal appearance. She is well-developed. She is not toxic-appearing.  HENT:     Head: Normocephalic and atraumatic.  Eyes:     General: Lids are normal.     Conjunctiva/sclera: Conjunctivae normal.     Pupils: Pupils are equal, round, and reactive to light.  Neck:     Musculoskeletal: Normal range of motion and neck supple.     Thyroid: No thyroid mass.     Trachea: No tracheal deviation.  Cardiovascular:     Rate and Rhythm: Normal rate and regular rhythm.     Heart sounds: Normal heart sounds. No murmur. No gallop.   Pulmonary:     Effort: Tachypnea present. No respiratory distress.     Breath sounds: No stridor. Examination of the right-lower field reveals wheezing. Examination of the left-lower field reveals decreased breath sounds and wheezing. Decreased breath sounds and wheezing present. No rhonchi or rales.  Abdominal:     General: Bowel sounds are normal. There is no distension.     Palpations: Abdomen is soft.     Tenderness: There is no abdominal tenderness. There is no rebound.  Musculoskeletal: Normal range of motion.        General: No tenderness.  Skin:    General: Skin is warm and dry.     Findings: No abrasion or rash.  Neurological:     Mental Status: She is alert and oriented to person, place, and time.     GCS: GCS eye subscore is 4. GCS verbal subscore is 5. GCS motor subscore is 6.     Cranial Nerves: No cranial nerve deficit.     Sensory: No sensory deficit.  Psychiatric:        Speech: Speech normal.        Behavior: Behavior normal.      ED Treatments / Results  Labs (all labs ordered are listed, but only abnormal results are displayed) Labs Reviewed  RESPIRATORY PANEL BY RT PCR (FLU A&B, COVID)  CBC WITH DIFFERENTIAL/PLATELET  BASIC METABOLIC PANEL  BRAIN NATRIURETIC PEPTIDE    EKG EKG Interpretation  Date/Time:  Tuesday January 25 2019 10:10:15 EST Ventricular  Rate:  118 PR Interval:    QRS Duration: 86 QT Interval:  330 QTC Calculation: 463 R Axis:   60 Text Interpretation: Atrial fibrillation Low voltage, precordial leads Confirmed by Lacretia Leigh ([id number]) on 01/25/2019 10:58:48 AM   Radiology No results found.  Procedures Procedures (including critical care time)  Medications Ordered in ED Medications  0.9 %  sodium chloride infusion (has no administration in time range)  albuterol (VENTOLIN  HFA) 108 (90 Base) MCG/ACT inhaler 2 puff (has no administration in time range)  methylPREDNISolone sodium succinate (SOLU-MEDROL) 125 mg/2 mL injection 125 mg (has no administration in time range)     Initial Impression / Assessment and Plan / ED Course  I have reviewed the triage vital signs and the nursing notes.  Pertinent labs & imaging results that were available during my care of the patient were reviewed by me and considered in my medical decision making (see chart for details).        Patient has evidence of CHF on her chest x-ray.  BNP is elevated 2.  She is Covid and flu negative.  Given Lasix here as well as Ativan 0.5 for anxiety will be admitted to the hospitalist service.  CRITICAL CARE Performed by: Toy Baker Total critical care time: 60 minutes Critical care time was exclusive of separately billable procedures and treating other patients. Critical care was necessary to treat or prevent imminent or life-threatening deterioration. Critical care was time spent personally by me on the following activities: development of treatment plan with patient and/or surrogate as well as nursing, discussions with consultants, evaluation of patient's response to treatment, examination of patient, obtaining history from patient or surrogate, ordering and performing treatments and interventions, ordering and review of laboratory studies, ordering and review of radiographic studies, pulse oximetry and re-evaluation of patient's condition.   ANAYELY CONSTANTINE was evaluated in Emergency Department on 01/25/2019 for the symptoms described in the history of present illness. She was evaluated in the context of the global COVID-19 pandemic, which necessitated consideration that the patient might be at risk for infection with the SARS-CoV-2 virus that causes COVID-19. Institutional protocols and algorithms that pertain to the evaluation of patients at risk for COVID-19 are in a state of rapid change based on information released by regulatory bodies including the CDC and federal and state organizations. These policies and algorithms were followed during the patient's care in the ED.  Final Clinical Impressions(s) / ED Diagnoses   Final diagnoses:  None    ED Discharge Orders    None       Lorre Nick, MD 01/25/19 1155

## 2019-01-25 NOTE — ED Triage Notes (Signed)
Pt BIB [facility] from [facility]. Pt c/o SOB and a-fib. Pt was anxious on scene with EMS once in the back of the truck pt was able to calm down. Pt is HTN hx of. Pt is CAOx4.

## 2019-01-25 NOTE — H&P (Signed)
History and Physical    Cheryl Graves:[id number] DOB: 10-Aug-1928 DOA: 01/25/2019  PCP: [facility]   Patient coming from: [place] have personally briefly reviewed patient's old medical records in [place]  Chief Complaint: SOB  HPI: Cheryl Graves is a 85 y.o. female with medical history significant of COPD, Paroxysmal Atrial Fibrillation (recent diagnosis, placed on apixaban), HTN, T2DM who presents from nursing home with worsening shortness of breath.  Spoke with patient's niece Cheryl Graves, who reports a few days ago her facility thought she was developing symptoms of heart failure.  The patient reportedly has been sleeping sitting up for the past 2-3 months while she has been residing there and they had begun to notice some swelling in her lower extremities.  She was recently seen in pulmonary clinic and evaluated by Dr. Halford Chessman and his impression was that she had Centrilobular emphysema and had placed her on spiriva in addition to duonebs prn.  She has to be scheduled for spirometry.  At baseline she is on 2L nasal cannula.  Spoke with the facility, they felt she was very anxious and heart rate was in the 160s so they sent her in.  They did not necessarily feel she was more sob on their evaluation this morning.  On November 16,they performed a CXR which showed pulm edema so they began lasix 20 mg daily.  She ambulates short distances with a walker, she uses a wheelchair for longer distances.   Review of Systems: As per HPI otherwise 10 point review of systems negative.    Past Medical History:  Diagnosis Date  . Diabetes mellitus without complication ([place])   . Diverticulitis   . Hypertension     Past Surgical History:  Procedure Laterality Date  . APPENDECTOMY    . TONSILLECTOMY       reports that she quit smoking about 2 months ago. She has never used smokeless tobacco. She reports that she does not drink alcohol or use  drugs.  Allergies  Allergen Reactions  . Ciprofloxacin Diarrhea and Nausea Only  . Metronidazole Diarrhea and Nausea Only  . Septra [Sulfamethoxazole-Trimethoprim] Diarrhea   Family Hx reviewed, non-contributory  Prior to Admission medications   Medication Sig Start Date End Date Taking? Authorizing Provider  acetaminophen (TYLENOL) 325 MG tablet Take 650 mg by mouth every 6 (six) hours as needed for moderate pain.   Yes [provider]  apixaban (ELIQUIS) 2.5 MG TABS tablet Take 1 tablet (2.5 mg total) by mouth 2 (two) times daily. 12/21/18  Yes Sherran Needs, NP  COMBIVENT RESPIMAT 20-100 MCG/ACT AERS respimat Inhale 1-2 puffs into the lungs every 6 (six) hours as needed for shortness of breath or wheezing. 01/20/19  Yes [provider]  Cyanocobalamin (VITAMIN DEFICIENCY SYSTEM-B12) 1000 MCG/ML KIT Inject 1,000 mcg as directed every 30 (thirty) days.   Yes [provider]  dextromethorphan (DELSYM) 30 MG/5ML liquid Take 60 mg by mouth every 12 (twelve) hours as needed for cough.   Yes [provider]  hydrALAZINE (APRESOLINE) 50 MG tablet Take 1 tablet (50 mg total) by mouth 3 (three) times daily. 11/18/18  Yes Mariel Aloe, MD  dextromethorphan-guaiFENesin [facility] DM) 30-600 MG 12hr tablet Take 1 tablet by mouth 2 (two) times daily.    [provider]  famotidine (PEPCID) 20 MG tablet Take 20 mg by mouth 2 (two) times daily.    [provider]  ferrous sulfate 325 (65 FE)  MG tablet Take 325 mg by mouth daily.    [provider]  ipratropium-albuterol (DUONEB) 0.5-2.5 (3) MG/3ML SOLN Take 3 mLs by nebulization every 6 (six) hours as needed.    [provider]  ivermectin (STROMECTOL) 3 MG TABS tablet Take 1.5 mg by mouth once a week. 01/20/19   [provider]  lisinopril (ZESTRIL) 40 MG tablet Take 1 tablet (40 mg total) by mouth daily. 11/19/18   Mariel Aloe, MD  metoprolol tartrate (LOPRESSOR) 25 MG  tablet Take 0.5 tablets (12.5 mg total) by mouth 2 (two) times daily. 12/14/18   Carlisle Cater, PA-C  mirabegron ER (MYRBETRIQ) 25 MG TB24 tablet Take 1 tablet (25 mg total) by mouth daily. 11/18/18   Mariel Aloe, MD  ondansetron (ZOFRAN) 4 MG tablet Take 4 mg by mouth every 8 (eight) hours as needed for nausea or vomiting.    [provider]  pantoprazole (PROTONIX) 40 MG tablet Take 40 mg by mouth 2 (two) times daily.    [provider]  sodium chloride 1 g tablet Take 1 g by mouth 3 (three) times daily.    [provider]  Tiotropium Bromide Monohydrate (SPIRIVA RESPIMAT) 2.5 MCG/ACT AERS Inhale 2 puffs into the lungs daily. 01/06/19   Chesley Mires, MD  traZODone (DESYREL) 50 MG tablet Take 50 mg by mouth at bedtime.    [provider]    Physical Exam: Vitals:   01/25/19 1006 01/25/19 1011  BP:  (!) 161/102  Pulse:  (!) 112  Resp:  (!) 32  Temp:  97.8 F (36.6 C)  TempSrc:  Oral  SpO2: 99% 98%     Vitals:   01/25/19 1006 01/25/19 1011  BP:  (!) 161/102  Pulse:  (!) 112  Resp:  (!) 32  Temp:  97.8 F (36.6 C)  TempSrc:  Oral  SpO2: 99% 98%    Constitutional: Seated upright, diffuse wheezing audible; hard of hearing Eyes: EOMI, anicteric sclera ENMT: Mucous membranes are moist. Posterior pharynx clear of any exudate or lesions.Normal dentition.  Neck: normal, supple, no masses, no thyromegaly Respiratory: diminished at bases, wheezing throughout the remaining lung fields Cardiovascular: Regular rate and rhythm, no murmurs / rubs / gallops. 1+ BL Lower extremity edema. 2+ pedal pulses. No carotid bruits.  Abdomen: no tenderness, no masses palpated. No hepatosplenomegaly. Bowel sounds positive.  Musculoskeletal: no clubbing / cyanosis. No joint deformity upper and lower extremities. Good ROM, no contractures. Normal muscle tone.  Skin: no rashes, lesions, ulcers. No induration Neurologic: CN 2-12 grossly intact. Strength and sensation  grossly intact Psychiatric: guarded judgment and insight    Labs on Admission: I have personally reviewed following labs and imaging studies  CBC: Recent Labs  Lab 01/25/19 1039  WBC 9.0  NEUTROABS 7.4  HGB 11.6*  HCT 37.2  MCV 94.2  PLT 027   Basic Metabolic Panel: Recent Labs  Lab 01/25/19 1039  NA 137  K 5.4*  CL 103  CO2 25  GLUCOSE 194*  BUN 21  CREATININE 1.16*  CALCIUM 8.6*   GFR: CrCl cannot be calculated (Unknown ideal weight.). Liver Function Tests: No results for input(s): AST, ALT, ALKPHOS, BILITOT, PROT, ALBUMIN in the last 168 hours. No results for input(s): LIPASE, AMYLASE in the last 168 hours. No results for input(s): AMMONIA in the last 168 hours. Coagulation Profile: No results for input(s): INR, PROTIME in the last 168 hours. Cardiac Enzymes: No results for input(s): CKTOTAL, CKMB, CKMBINDEX, TROPONINI in the  last 168 hours. BNP (last 3 results) No results for input(s): PROBNP in the last 8760 hours. HbA1C: No results for input(s): HGBA1C in the last 72 hours. CBG: No results for input(s): GLUCAP in the last 168 hours. Lipid Profile: No results for input(s): CHOL, HDL, LDLCALC, TRIG, CHOLHDL, LDLDIRECT in the last 72 hours. Thyroid Function Tests: No results for input(s): TSH, T4TOTAL, FREET4, T3FREE, THYROIDAB in the last 72 hours. Anemia Panel: No results for input(s): VITAMINB12, FOLATE, FERRITIN, TIBC, IRON, RETICCTPCT in the last 72 hours. Urine analysis:    Component Value Date/Time   COLORURINE YELLOW 11/13/2018 2200   APPEARANCEUR CLOUDY (A) 11/13/2018 2200   LABSPEC 1.009 11/13/2018 2200   PHURINE 6.0 11/13/2018 2200   GLUCOSEU NEGATIVE 11/13/2018 2200   HGBUR SMALL (A) 11/13/2018 2200   BILIRUBINUR NEGATIVE 11/13/2018 2200   KETONESUR NEGATIVE 11/13/2018 2200   PROTEINUR NEGATIVE 11/13/2018 2200   NITRITE POSITIVE (A) 11/13/2018 2200   LEUKOCYTESUR LARGE (A) 11/13/2018 2200    Radiological Exams on Admission: Dg Chest  Port 1 View  Result Date: 01/25/2019 CLINICAL DATA:  Shortness of breath and AFib. EXAM: PORTABLE CHEST 1 VIEW COMPARISON:  12/14/2018 FINDINGS: Cardiomediastinal contours are enlarged. There is diffuse interstitial prominence with graded opacity in the left and right chest along with opacification over right hemidiaphragm and left chest in the retrocardiac region. No sign of acute bone process. IMPRESSION: 1. Worsening generalized increased density at the lung bases may reflect enlarging effusions in this patient likely with heart failure or volume overload. 2. Difficult to exclude infection at the lung bases. Electronically Signed   By: Zetta Bills M.D.   On: 01/25/2019 11:13    EKG: Independently reviewed.   Assessment/Plan  SONIYAH MCGLORY is a 85 y.o. female with medical history significant of COPD, Paroxysmal Atrial Fibrillation (recent diagnosis, placed on apixaban), HTN, T2DM who presents from nursing home with worsening shortness of breath and acute on chronic hypoxemic resp failure 2/2 combined acute chf exacerbation and copd exacerbation.  # Acute on chronic hypoxemic respiratory failure # Acute exacerbation of COPD # Acute CHF exacerbation - patient on baseline 2 L of oxygen but now with increased supplementation up to 4L and increased work of breathing.  She has symptoms of orthopnea at facility.  CXR shows fluid at bases as well as elevated BNP.  She has not previously had full work-up of her A. Fib and no prior EF on file.  She may have underlying infection as well though this is less evident at the time of presentation.  She was given lasix and solumedrol in ER. - for now as she begins treatment, would continue management for both COPD and CHF exacerbations, continue IV Diuresis with close electrolyte monitoring, continue solumedrol and empirically cover for pneumonia - continue scheduled nebs and prn - continue IV lasix 40 mg BID - Continue solumedrol 40 mg daily - continue  ceftriaxone and azithromycin (if procalc neg x 2, would d/c ceftriaxone) - taper off therapies (I.e. antibiotics if infection does not appear present) - Obtain Echo  # Paroxysmal Atrial Fibrillation - CHADS2VASC - 5 - patient's TSH wnl, has not had echo/structural eval or valvular - has been started on Apixaban by Cardiology NP - continue metoprolol and apixaban  # HTN - continue lisinopril, hydralazine and metoprolol  # T2DM - last A1c 5.7 - not on oral agents, continue to monitor - will add coverage if spikes from steroids  # Chronic Hyponatremia - close to eunatremia  -  continue pta salt tabs  # GERD - continue PPI  DVT prophylaxis: Apixaban Code Status: Full Family Communication: Spoke with updated niece, Cheryl Graves Disposition Plan: pending Admission status:tele   Truddie Hidden MD [facility] Pager [phone]  If 7PM-7AM, please contact night-coverage [email or web address] Password [email or web address]  01/25/2019, 11:52 AM

## 2019-01-25 NOTE — ED Notes (Signed)
Called Report to Nurse Crystal on floor.

## 2019-01-25 NOTE — Progress Notes (Signed)
  Echocardiogram 2D Echocardiogram has been performed.  Cheryl Graves G Fynn Vanblarcom 01/25/2019, 3:57 PM

## 2019-01-25 NOTE — ED Notes (Signed)
ED TO INPATIENT HANDOFF REPORT  ED Nurse Name and Phone #: Krystel Fletchall    S Name/Age/Gender Cheryl Graves 85 y.o. female Room/Bed: WA12/WA12  Code Status   Code Status: Prior  Home/SNF/Other Skilled nursing facility Patient oriented to: self, place, time and situation Is this baseline? Yes   Triage Complete: Triage complete  Chief Complaint shob Afib  Triage Note Pt BIB [facility] from [facility]. Pt c/o SOB and a-fib. Pt was anxious on scene with EMS once in the back of the truck pt was able to calm down. Pt is HTN hx of. Pt is CAOx4.   Allergies Allergies  Allergen Reactions  . Ciprofloxacin Diarrhea and Nausea Only  . Metronidazole Diarrhea and Nausea Only  . Septra [Sulfamethoxazole-Trimethoprim] Diarrhea    Level of Care/Admitting Diagnosis ED Disposition    ED Disposition Condition Comment   Admit  Hospital Area: [facility] [[id number]]  Level of Care: Med-Surg [16]  Covid Evaluation: Confirmed COVID Negative  Diagnosis: Acute CHF (congestive heart failure) [facility]) [[id number]]  Admitting Physician: Clydia Llano [[id number]]  Attending Physician: Clydia Llano [[id number]]  Estimated length of stay: past midnight tomorrow  Certification:: I certify this patient will need inpatient services for at least 2 midnights  PT Class (Do Not Modify): Inpatient [101]  PT Acc Code (Do Not Modify): Private [1]       B Medical/Surgery History Past Medical History:  Diagnosis Date  . Diabetes mellitus without complication ([facility])   . Diverticulitis   . Hypertension    Past Surgical History:  Procedure Laterality Date  . APPENDECTOMY    . TONSILLECTOMY       A IV Location/Drains/Wounds Patient Lines/Drains/Airways Status   Active Line/Drains/Airways    Name:   Placement date:   Placement time:   Site:   Days:   Peripheral IV 01/25/19 Left;Posterior Hand   01/25/19    -    Hand   less than 1          Intake/Output Last 24 hours No intake or  output data in the 24 hours ending 01/25/19 1638  Labs/Imaging Results for orders placed or performed during the hospital encounter of 01/25/19 (from the past 48 hour(s))  CBC with Differential     Status: Abnormal   Collection Time: 01/25/19 10:39 AM  Result Value Ref Range   WBC 9.0 4.0 - 10.5 K/uL   RBC 3.95 3.87 - 5.11 MIL/uL   Hemoglobin 11.6 (L) 12.0 - 15.0 g/dL   HCT 29.5 62.1 - 30.8 %   MCV 94.2 80.0 - 100.0 fL   MCH 29.4 26.0 - 34.0 pg   MCHC 31.2 30.0 - 36.0 g/dL   RDW 65.7 84.6 - 96.2 %   Platelets 329 150 - 400 K/uL   nRBC 0.0 0.0 - 0.2 %   Neutrophils Relative % 83 %   Neutro Abs 7.4 1.7 - 7.7 K/uL   Lymphocytes Relative 10 %   Lymphs Abs 0.9 0.7 - 4.0 K/uL   Monocytes Relative 5 %   Monocytes Absolute 0.5 0.1 - 1.0 K/uL   Eosinophils Relative 1 %   Eosinophils Absolute 0.1 0.0 - 0.5 K/uL   Basophils Relative 1 %   Basophils Absolute 0.1 0.0 - 0.1 K/uL   Immature Granulocytes 0 %   Abs Immature Granulocytes 0.03 0.00 - 0.07 K/uL    Comment: Performed at [facility], [address], [place], [place] [address]  BMET     Status: Abnormal  Collection Time: 01/25/19 10:39 AM  Result Value Ref Range   Sodium 137 135 - 145 mmol/L   Potassium 5.4 (H) 3.5 - 5.1 mmol/L    Comment: SPECIMEN HEMOLYZED. HEMOLYSIS MAY AFFECT INTEGRITY OF RESULTS.   Chloride 103 98 - 111 mmol/L   CO2 25 22 - 32 mmol/L   Glucose, Bld 194 (H) 70 - 99 mg/dL   BUN 21 8 - 23 mg/dL   Creatinine, Ser 1.611.16 (H) 0.44 - 1.00 mg/dL   Calcium 8.6 (L) 8.9 - 10.3 mg/dL   GFR calc non Af Amer 42 (L) >60 mL/min   GFR calc Af Amer 49 (L) >60 mL/min   Anion gap 9 5 - 15    Comment: Performed at [facility], [address] [place], [place] [address]  BNP     Status: Abnormal   Collection Time: 01/25/19 10:39 AM  Result Value Ref Range   B Natriuretic Peptide 497.8 (H) 0.0 - 100.0 pg/mL    Comment: Performed at [facility], [address]  [place], [place] [address]  Respiratory Panel by RT PCR (Flu A&B, Covid) - Nasopharyngeal Swab     Status: None   Collection Time: 01/25/19 10:39 AM   Specimen: Nasopharyngeal Swab  Result Value Ref Range   SARS Coronavirus 2 by RT PCR NEGATIVE NEGATIVE    Comment: (NOTE) SARS-CoV-2 target nucleic acids are NOT DETECTED. The SARS-CoV-2 RNA is generally detectable in upper respiratoy specimens during the acute phase of infection. The lowest concentration of SARS-CoV-2 viral copies this assay can detect is 131 copies/mL. A negative result does not preclude SARS-Cov-2 infection and should not be used as the sole basis for treatment or other patient management decisions. A negative result may occur with  improper specimen collection/handling, submission of specimen other than nasopharyngeal swab, presence of viral mutation(s) within the areas targeted by this assay, and inadequate number of viral copies (<131 copies/mL). A negative result must be combined with clinical observations, patient history, and epidemiological information. The expected result is Negative. Fact Sheet for Patients:  https://www.moore.com/https://www.fda.gov/media/142436/download Fact Sheet for Healthcare Providers:  https://www.young.biz/https://www.fda.gov/media/142435/download This test is not yet ap proved or cleared by the [place] FDA and  has been authorized for detection and/or diagnosis of SARS-CoV-2 by FDA under an Emergency Use Authorization (EUA). This EUA will remain  in effect (meaning this test can be used) for the duration of the COVID-19 declaration under Section 564(b)(1) of the Act, 21 U.S.C. section 360bbb-3(b)(1), unless the authorization is terminated or revoked sooner.    Influenza A by PCR NEGATIVE NEGATIVE   Influenza B by PCR NEGATIVE NEGATIVE    Comment: (NOTE) The Xpert Xpress SARS-CoV-2/FLU/RSV assay is intended as an aid in  the diagnosis of influenza from Nasopharyngeal swab specimens and  should not be used as a sole basis  for treatment. Nasal washings and  aspirates are unacceptable for Xpert Xpress SARS-CoV-2/FLU/RSV  testing. Fact Sheet for Patients: https://www.moore.com/https://www.fda.gov/media/142436/download Fact Sheet for Healthcare Providers: https://www.young.biz/https://www.fda.gov/media/142435/download This test is not yet approved or cleared by the [place] FDA and  has been authorized for detection and/or diagnosis of SARS-CoV-2 by  FDA under an Emergency Use Authorization (EUA). This EUA will remain  in effect (meaning this test can be used) for the duration of the  Covid-19 declaration under Section 564(b)(1) of the Act, 21  U.S.C. section 360bbb-3(b)(1), unless the authorization is  terminated or revoked. Performed at [facility], [address] [place],  [place] [address]   Strep pneumoniae urinary antigen     Status: None   Collection Time: 01/25/19 12:30 PM  Result Value Ref Range   Strep Pneumo Urinary Antigen NEGATIVE NEGATIVE    Comment:        Infection due to S. pneumoniae cannot be absolutely ruled out since the antigen present may be below the detection limit of the test. Performed at [facility], [address], [place], [place] [address]    Dg Chest Port 1 View  Result Date: 01/25/2019 CLINICAL DATA:  Shortness of breath and AFib. EXAM: PORTABLE CHEST 1 VIEW COMPARISON:  12/14/2018 FINDINGS: Cardiomediastinal contours are enlarged. There is diffuse interstitial prominence with graded opacity in the left and right chest along with opacification over right hemidiaphragm and left chest in the retrocardiac region. No sign of acute bone process. IMPRESSION: 1. Worsening generalized increased density at the lung bases may reflect enlarging effusions in this patient likely with heart failure or volume overload. 2. Difficult to exclude infection at the lung bases. Electronically Signed   By: Zetta Bills M.D.   On: 01/25/2019 11:13    Pending Labs Unresulted Labs (From admission, onward)     Start     Ordered   01/25/19 1209  Legionella Pneumophila Serogp 1 Ur Ag  Once,   STAT     01/25/19 1208   Signed and Held  HIV Antibody (routine testing w rflx)  (HIV Antibody (Routine testing w reflex) panel)  Once,   R     Signed and Held   Signed and Held  Culture, sputum-assessment  Once,   R     Signed and Held   Signed and Occupational hygienist morning,   R     Signed and Held   Signed and Held  CBC WITH DIFFERENTIAL  Tomorrow morning,   R     Signed and Held   Signed and Held  Procalcitonin - Baseline  ONCE - STAT,   STAT     Signed and Held   Signed and Held  Respiratory Panel by PCR  (Respiratory virus panel with precautions)  Once,   R     Signed and Held   Signed and Held  Procalcitonin  Daily,   R     Signed and Held   Signed and Held  CBC  (enoxaparin (LOVENOX)    CrCl >/= 30 ml/min)  Once,   R    Comments: Baseline for enoxaparin therapy IF NOT ALREADY DRAWN.  Notify MD if PLT < 100 K.    Signed and Held   Signed and Held  Creatinine, serum  (enoxaparin (LOVENOX)    CrCl >/= 30 ml/min)  Once,   R    Comments: Baseline for enoxaparin therapy IF NOT ALREADY DRAWN.    Signed and Held   Signed and Held  Creatinine, serum  (enoxaparin (LOVENOX)    CrCl >/= 30 ml/min)  Weekly,   R    Comments: while on enoxaparin therapy    Signed and Held          Vitals/Pain Today's Vitals   01/25/19 1230 01/25/19 1400 01/25/19 1415 01/25/19 1630  BP: (!) 159/99 (!) 143/79 (!) 142/85 119/89  Pulse: 66 89 70 (!) 129  Resp: 19 15 16 17   Temp:      TempSrc:      SpO2: 100% 97% 98% 97%  PainSc:        Isolation  Precautions No active isolations  Medications Medications  0.9 %  sodium chloride infusion (has no administration in time range)  furosemide (LASIX) injection 40 mg (has no administration in time range)  methylPREDNISolone sodium succinate (SOLU-MEDROL) 40 mg/mL injection 40 mg (has no administration in time range)  albuterol (VENTOLIN HFA) 108  (90 Base) MCG/ACT inhaler 2 puff (2 puffs Inhalation Given 01/25/19 1031)  methylPREDNISolone sodium succinate (SOLU-MEDROL) 125 mg/2 mL injection 125 mg (125 mg Intravenous Given 01/25/19 1039)  LORazepam (ATIVAN) injection 0.5 mg (0.5 mg Intravenous Given 01/25/19 1146)  furosemide (LASIX) injection 40 mg (40 mg Intravenous Given 01/25/19 1146)    Mobility walks Low fall risk   Focused Assessments Pulmonary Assessment Handoff:  Lung sounds: Bilateral Breath Sounds: Expiratory wheezes, Diminished L Breath Sounds: Diminished, Expiratory wheezes R Breath Sounds: Diminished, Expiratory wheezes O2 Device: Nasal Cannula O2 Flow Rate (L/min): 2 L/min      R Recommendations: See Admitting Provider Note  Report given to:   Additional Notes:

## 2019-01-26 LAB — CBC WITH DIFFERENTIAL/PLATELET
Abs Immature Granulocytes: 0.04 10*3/uL (ref 0.00–0.07)
Basophils Absolute: 0 10*3/uL (ref 0.0–0.1)
Basophils Relative: 0 %
Eosinophils Absolute: 0 10*3/uL (ref 0.0–0.5)
Eosinophils Relative: 0 %
HCT: 35.1 % — ABNORMAL LOW (ref 36.0–46.0)
Hemoglobin: 10.8 g/dL — ABNORMAL LOW (ref 12.0–15.0)
Immature Granulocytes: 1 %
Lymphocytes Relative: 13 %
Lymphs Abs: 1.1 10*3/uL (ref 0.7–4.0)
MCH: 29 pg (ref 26.0–34.0)
MCHC: 30.8 g/dL (ref 30.0–36.0)
MCV: 94.4 fL (ref 80.0–100.0)
Monocytes Absolute: 0.7 10*3/uL (ref 0.1–1.0)
Monocytes Relative: 8 %
Neutro Abs: 6.8 10*3/uL (ref 1.7–7.7)
Neutrophils Relative %: 78 %
Platelets: 307 10*3/uL (ref 150–400)
RBC: 3.72 MIL/uL — ABNORMAL LOW (ref 3.87–5.11)
RDW: 14 % (ref 11.5–15.5)
WBC: 8.7 10*3/uL (ref 4.0–10.5)
nRBC: 0 % (ref 0.0–0.2)

## 2019-01-26 LAB — BASIC METABOLIC PANEL
Anion gap: 12 (ref 5–15)
BUN: 27 mg/dL — ABNORMAL HIGH (ref 8–23)
CO2: 29 mmol/L (ref 22–32)
Calcium: 8.6 mg/dL — ABNORMAL LOW (ref 8.9–10.3)
Chloride: 97 mmol/L — ABNORMAL LOW (ref 98–111)
Creatinine, Ser: 1.35 mg/dL — ABNORMAL HIGH (ref 0.44–1.00)
GFR calc Af Amer: 41 mL/min — ABNORMAL LOW (ref >60)
GFR calc non Af Amer: 35 mL/min — ABNORMAL LOW (ref >60)
Glucose, Bld: 144 mg/dL — ABNORMAL HIGH (ref 70–99)
Potassium: 4 mmol/L (ref 3.5–5.1)
Sodium: 138 mmol/L (ref 135–145)

## 2019-01-26 LAB — LEGIONELLA PNEUMOPHILA SEROGP 1 UR AG: L. pneumophila Serogp 1 Ur Ag: NEGATIVE

## 2019-01-26 LAB — PROCALCITONIN: Procalcitonin: <0.1 ng/mL

## 2019-01-26 MED ORDER — FUROSEMIDE 40 MG PO TABS: 40.0000 mg | ORAL_TABLET | Freq: Every day | ORAL | Status: DC

## 2019-01-26 MED ORDER — ENSURE ENLIVE PO LIQD
237.0000 mL | Freq: Two times a day (BID) | ORAL | Status: DC
  Administered 2019-01-26 – 2019-01-30 (×6): 237 mL via ORAL

## 2019-01-26 NOTE — Progress Notes (Addendum)
PROGRESS NOTE    Cheryl FeilLinda F Graves  ZOX:[id number] DOB: [birth year] DOA: 01/25/2019 [place]: [place], [facility]   Brief Narrative:  Patient is a 85 year old female who is [facility] [place] with medical history of COPD, proximal A. Fib, hypertension, diabetes type 2 who presented with shortness of breath.  As per the report, she was having orthopnea, swelling of lower extremities.  She follows with Dr. Craige CottaSood as an outpatient for her COPD.  She is on inhalers at home and on 2 L of oxygen via nasal cannula.  Patient was also noted to be tachycardic at that nursing facility.  On emergency department ,chest x-ray showed possible congestive heart failure with bilateral pleural effusions.  She was started on IV Lasix.  Also started on broad start antibiotics for possible pneumonia. This morning she remains hemodynamically stable.  Respiratory status has improved.  Plan to discharge back to her facility tomorrow.  Waiting for PT/OT evaluation.  Assessment & Plan:   Active Problems:   Acute CHF (congestive heart failure) (HCC)   Acute on chronic hypoxic respiratory failure: Secondary to COPD exacerbation but could be also due to pneumonia or new onset diastolic CHF.  Currently she is on 2 L of oxygen per minute which is her baseline.  Her respiratory status has significantly improved today.  COPD exacerbation: Follows with Dr. Craige CottaSood, pulmonology.  On 2 L of oxygen at baseline.  Continue bronchodilators as needed.  She is on inhalers at home.  Possible community-acquired pneumonia: Chest x-ray could not  out pneumonia.  She is afebrile, no leukocytosis.  Continue current antibiotics.  Possible diastolic CHF: Echocardiogram showed normal left ventricular ejection fraction, mildly reduced right ventricular systolic function.  She was complaining of orthopnea and bilateral lower extremity edema.  Currently she appears near euvolemic status.  I will discontinue IV Lasix  and put on 40 mg of oral Lasix from tomorrow.  Continue to monitor daily weight, input/output.  CKD stage IIIa: Creatinine ranges from 1.2-1.5.  Currently kidney function at baseline.  Paroxysmal A. fib: Rate controlled.  On Eliquis.  On metoprolol for rate control.  Hypertension: Currently above stable.  Continue lisinopril, hydralazine, metoprolol  Diabetes type 2: Last hemoglobin C 5.7.  Not on oral agents at home.  Continue sliding-scale insulin   Hyponatremia: Currently stable  GERD: Continue PPI  Advanced age/debility/deconditioning: Resides in assisted living facility.  Waiting for PT/OT evaluation.         DVT prophylaxis:Eilquis Code Status: Full Family Communication:Called niece on phone 01/26/19 Disposition Plan: Discharge back to ALF tomorrow   Consultants: None  Procedures: None  Antimicrobials:  Anti-infectives (From admission, onward)   Start     Dose/Rate Route Frequency Ordered Stop   01/26/19 1800  azithromycin (ZITHROMAX) tablet 500 mg     500 mg Oral Daily 01/25/19 1728 01/30/19 0959   01/25/19 2000  cefTRIAXone (ROCEPHIN) 1 g in sodium chloride 0.9 % 100 mL IVPB     1 g 200 mL/hr over 30 Minutes Intravenous Every 24 hours 01/25/19 1728     01/25/19 1800  azithromycin (ZITHROMAX) 500 mg in sodium chloride 0.9 % 250 mL IVPB     500 mg 250 mL/hr over 60 Minutes Intravenous Every 24 hours 01/25/19 1728 01/25/19 2100      Subjective: Patient seen and examined at bedside this morning.  Hemodynamically stable.  Looks much better today.  Respiratory status stable.  Not short of breath.  Not coughing.  Feels much  better  Objective: Vitals:   01/25/19 2042 01/25/19 2151 01/26/19 0541 01/26/19 0838  BP:  120/64 123/78   Pulse:  99 98   Resp:  20 17   Temp:  97.8 F (36.6 C) (!) 97.5 F (36.4 C)   TempSrc:  Oral Oral   SpO2: 94% 93% 97% 96%  Weight:   87.1 kg   Height:        Intake/Output Summary (Last 24 hours) at 01/26/2019 1141 Last data  filed at 01/26/2019 1121 Gross per 24 hour  Intake 357.61 ml  Output 1600 ml  Net -1242.39 ml   Filed Weights   01/25/19 1813 01/26/19 0541  Weight: 72.3 kg 87.1 kg    Examination:  General exam: Pleasant elderly female, comfortable  HEENT:PERRL,Oral mucosa moist, Ear/Nose normal on gross exam Respiratory system: Bilateral diminished breath sounds on the bases Cardiovascular system: Afib. No JVD, murmurs, rubs, gallops or clicks. No pedal edema. Gastrointestinal system: Abdomen is nondistended, soft and nontender. No organomegaly or masses felt. Normal bowel sounds heard. Central nervous system: Alert and oriented. No focal neurological deficits. Extremities: No edema, no clubbing ,no cyanosis, distal peripheral pulses palpable. Skin: No rashes, lesions or ulcers,no icterus ,no pallor      Data Reviewed: I have personally reviewed following labs and imaging studies  CBC: Recent Labs  Lab 01/25/19 1039 01/25/19 1752 01/26/19 0642  WBC 9.0 5.1 8.7  NEUTROABS 7.4  --  6.8  HGB 11.6* 11.4* 10.8*  HCT 37.2 36.8 35.1*  MCV 94.2 94.8 94.4  PLT 329 310 307   Basic Metabolic Panel: Recent Labs  Lab 01/25/19 1039 01/25/19 1752 01/26/19 0642  NA 137  --  138  K 5.4*  --  4.0  CL 103  --  97*  CO2 25  --  29  GLUCOSE 194*  --  144*  BUN 21  --  27*  CREATININE 1.16* 1.24* 1.35*  CALCIUM 8.6*  --  8.6*   GFR: Estimated Creatinine Clearance: 30.8 mL/min (A) (by C-G formula based on SCr of 1.35 mg/dL (H)). Liver Function Tests: No results for input(s): AST, ALT, ALKPHOS, BILITOT, PROT, ALBUMIN in the last 168 hours. No results for input(s): LIPASE, AMYLASE in the last 168 hours. No results for input(s): AMMONIA in the last 168 hours. Coagulation Profile: No results for input(s): INR, PROTIME in the last 168 hours. Cardiac Enzymes: No results for input(s): CKTOTAL, CKMB, CKMBINDEX, TROPONINI in the last 168 hours. BNP (last 3 results) No results for input(s): PROBNP  in the last 8760 hours. HbA1C: No results for input(s): HGBA1C in the last 72 hours. CBG: No results for input(s): GLUCAP in the last 168 hours. Lipid Profile: No results for input(s): CHOL, HDL, LDLCALC, TRIG, CHOLHDL, LDLDIRECT in the last 72 hours. Thyroid Function Tests: No results for input(s): TSH, T4TOTAL, FREET4, T3FREE, THYROIDAB in the last 72 hours. Anemia Panel: No results for input(s): VITAMINB12, FOLATE, FERRITIN, TIBC, IRON, RETICCTPCT in the last 72 hours. Sepsis Labs: Recent Labs  Lab 01/25/19 1752 01/26/19 0642  PROCALCITON <0.10 <0.10    Recent Results (from the past 240 hour(s))  Respiratory Panel by RT PCR (Flu A&B, Covid) - Nasopharyngeal Swab     Status: None   Collection Time: 01/25/19 10:39 AM   Specimen: Nasopharyngeal Swab  Result Value Ref Range Status   SARS Coronavirus 2 by RT PCR NEGATIVE NEGATIVE Final    Comment: (NOTE) SARS-CoV-2 target nucleic acids are NOT DETECTED. The SARS-CoV-2 RNA is generally  detectable in upper respiratoy specimens during the acute phase of infection. The lowest concentration of SARS-CoV-2 viral copies this assay can detect is 131 copies/mL. A negative result does not preclude SARS-Cov-2 infection and should not be used as the sole basis for treatment or other patient management decisions. A negative result may occur with  improper specimen collection/handling, submission of specimen other than nasopharyngeal swab, presence of viral mutation(s) within the areas targeted by this assay, and inadequate number of viral copies (<131 copies/mL). A negative result must be combined with clinical observations, patient history, and epidemiological information. The expected result is Negative. Fact Sheet for Patients:  https://www.moore.com/ Fact Sheet for Healthcare Providers:  https://www.young.biz/ This test is not yet ap proved or cleared by the [place] FDA and  has been  authorized for detection and/or diagnosis of SARS-CoV-2 by FDA under an Emergency Use Authorization (EUA). This EUA will remain  in effect (meaning this test can be used) for the duration of the COVID-19 declaration under Section 564(b)(1) of the Act, 21 U.S.C. section 360bbb-3(b)(1), unless the authorization is terminated or revoked sooner.    Influenza A by PCR NEGATIVE NEGATIVE Final   Influenza B by PCR NEGATIVE NEGATIVE Final    Comment: (NOTE) The Xpert Xpress SARS-CoV-2/FLU/RSV assay is intended as an aid in  the diagnosis of influenza from Nasopharyngeal swab specimens and  should not be used as a sole basis for treatment. Nasal washings and  aspirates are unacceptable for Xpert Xpress SARS-CoV-2/FLU/RSV  testing. Fact Sheet for Patients: https://www.moore.com/ Fact Sheet for Healthcare Providers: https://www.young.biz/ This test is not yet approved or cleared by the [place] FDA and  has been authorized for detection and/or diagnosis of SARS-CoV-2 by  FDA under an Emergency Use Authorization (EUA). This EUA will remain  in effect (meaning this test can be used) for the duration of the  Covid-19 declaration under Section 564(b)(1) of the Act, 21  U.S.C. section 360bbb-3(b)(1), unless the authorization is  terminated or revoked. Performed at [facility] [address] [place], [place] [address]          Radiology Studies: Dg Chest Port 1 View  Result Date: 01/25/2019 CLINICAL DATA:  Shortness of breath and AFib. EXAM: PORTABLE CHEST 1 VIEW COMPARISON:  12/14/2018 FINDINGS: Cardiomediastinal contours are enlarged. There is diffuse interstitial prominence with graded opacity in the left and right chest along with opacification over right hemidiaphragm and left chest in the retrocardiac region. No sign of acute bone process. IMPRESSION: 1. Worsening generalized increased density at the lung bases may reflect  enlarging effusions in this patient likely with heart failure or volume overload. 2. Difficult to exclude infection at the lung bases. Electronically Signed   By: Donzetta Kohut M.D.   On: 01/25/2019 11:13        Scheduled Meds: . albuterol  10 mg Nebulization Once  . apixaban  2.5 mg Oral BID  . azithromycin  500 mg Oral Daily  . cholecalciferol  1,000 Units Oral Daily  . insulin aspart  10 Units Intravenous Once   And  . dextrose  1 ampule Intravenous Once  . fluticasone furoate-vilanterol  1 puff Inhalation Daily  . [START ON 01/27/2019] furosemide  40 mg Oral Daily  . hydrALAZINE  50 mg Oral TID  . lisinopril  40 mg Oral Daily  . Melatonin  2 mg Oral QHS  . methylPREDNISolone (SOLU-MEDROL) injection  40 mg Intravenous Q24H  . metoprolol tartrate  12.5 mg Oral BID  .  mirabegron ER  25 mg Oral Daily  . pantoprazole  40 mg Oral BID  . sodium chloride  1 g Oral TID  . umeclidinium bromide  1 puff Inhalation Daily  . vitamin C  1,000 mg Oral Daily   Continuous Infusions: . sodium chloride Stopped (01/25/19 2100)  . cefTRIAXone (ROCEPHIN)  IV Stopped (01/25/19 2100)     LOS: 1 day    Time spent: 35 mins.More than 50% of that time was spent in counseling and/or coordination of care.      Shelly Coss, MD [facility] Pager [phone]  If 7PM-7AM, please contact night-coverage [email or web address] Password [email or web address] 01/26/2019, 11:41 AM

## 2019-01-26 NOTE — Progress Notes (Signed)
OT Cancellation Note  Patient Details Name: Cheryl Graves MRN: [id number] DOB: [birth year]   Cancelled Treatment:     Noted pt from a SNF returning to a SNF Will defer OT to SNF Kari Baars, [place] [phone] Office- [phone], Edwena Felty D 01/26/2019, 3:36 PM

## 2019-01-26 NOTE — Evaluation (Signed)
Clinical/Bedside Swallow Evaluation Patient Details  Name: Cheryl Graves MRN: [id number] Date of Birth: [birth year]  Today's Date: 01/26/2019 Time: SLP Start Time (ACUTE ONLY): 0810 SLP Stop Time (ACUTE ONLY): 0829 SLP Time Calculation (min) (ACUTE ONLY): 19 min  Past Medical History:  Past Medical History:  Diagnosis Date  . Diabetes mellitus without complication ([place])   . Diverticulitis   . Hypertension    Past Surgical History:  Past Surgical History:  Procedure Laterality Date  . APPENDECTOMY    . TONSILLECTOMY     HPI:  85 yo female with h/o HTN, DM [facility]ing and difficulty breathing.  Pt COVID negative, CXR showed pulmonary edema.  Swallow eval ordered.  Pt admits to h/o GERD for which she takes reflux medications religiously.   Assessment / Plan / Recommendation Clinical Impression  Patient presents with funcitonal orophayngeal swallow ability based on clinical evaluation.  CN negative for changes impacting swallow musculature.  Pt with strong voice, timely swallow and able to feed self.  She did not pass [facility] screen due to coughing with attempts and increased WOB. However small boluses tolerated well.  Please note, pt has h/o reflux and reports sometimes she will vomit up pills approximately 15 minutes after taking or will dry heave.  Advised pt to compensation strategies to mitigate aspiration risk using teach back. Recommend continue diet and no SlP follow up indicated. Thanks. SLP Visit Diagnosis: Dysphagia, unspecified (R13.10)    Aspiration Risk  Mild aspiration risk    Diet Recommendation Regular;Thin liquid   Liquid Administration via: Cup;Straw Medication Administration: Whole meds with liquid Supervision: Patient able to self feed Compensations: Small sips/bites;Slow rate(rest if short of breath) Postural Changes: Seated upright at 90 degrees;Remain upright for at least 30 minutes after po intake    Other   Recommendations     Follow up Recommendations None      Frequency and Duration   n/a         Prognosis   n/a     Swallow Study   General Date of Onset: 01/26/19 HPI: 85 yo female with h/o HTN, DM [facility] with [facility]ing and difficulty breathing.  Pt COVID negative, CXR showed pulmonary edema.  Swallow eval ordered.  Pt admits to h/o GERD for which she takes reflux medications religiously. Type of Study: Bedside Swallow Evaluation Previous Swallow Assessment: none in the system Diet Prior to this Study: Regular;Thin liquids Temperature Spikes Noted: No Respiratory Status: Nasal cannula History of Recent Intubation: No Behavior/Cognition: Alert;Cooperative;Pleasant mood Oral Cavity Assessment: Dry Oral Care Completed by SLP: No Oral Cavity - Dentition: Dentures, top(partial lower) Vision: Functional for self-feeding Self-Feeding Abilities: Able to feed self Baseline Vocal Quality: Normal Volitional Cough: Strong Volitional Swallow: Able to elicit    Oral/Motor/Sensory Function Overall Oral Motor/Sensory Function: Within functional limits   Ice Chips Ice chips: Not tested   Thin Liquid Thin Liquid: Impaired Presentation: Cup;Self Fed;Straw Pharyngeal  Phase Impairments: Cough - Immediate Other Comments: cough with attempts at 3 ounce water test, no other indications of aspiration, increaesed WOB with Yale attempt    Nectar Thick Nectar Thick Liquid: Not tested   Honey Thick Honey Thick Liquid: Not tested   Puree Puree: Within functional limits Presentation: Spoon   Solid     Solid: Not tested      Macario Golds 01/26/2019,8:32 AM   Luanna Salk, [place] [facility] SLP [place] Pager [phone] Office  [phone]   

## 2019-01-26 NOTE — TOC Initial Note (Signed)
Transition of Care [facility]) - Initial/Assessment Note    Patient Details  Name: Cheryl Graves MRN: [id number] Date of Birth: May 12, 1928  Transition of Care [facility] CM/SW Contact:    Ida Rogue, LCSW Phone Number: 01/26/2019, 11:01 AM  Clinical Narrative:   [name] is a long term resident at [facility] in [place] with medical history significant of COPD, Paroxysmal Atrial Fibrillation  with worsening shortness of breath.  Niece [name] reports a few days ago her facility thought she was developing symptoms of heart failure.  [name] reportedly has been sleeping sitting up for the past 2-3 months while she has been residing there and they had begun to notice some swelling in her lower extremities.  At baseline she is on 2L nasal cannula.  She ambulates short distances with a walker, she uses a wheelchair for longer distances.     She will return to [facility] when medically stable.  TOC will continue to follow during the course of hospitalization.            Expected Discharge Plan: Skilled Nursing Facility Barriers to Discharge: No Barriers Identified   Patient Goals and CMS Choice        Expected Discharge Plan and Services Expected Discharge Plan: Skilled Nursing Facility   Discharge Planning Services: CM Consult   Living arrangements for the past 2 months: Skilled Nursing Facility                                      Prior Living Arrangements/Services Living arrangements for the past 2 months: Skilled Nursing Facility Lives with:: Facility Resident Patient language and need for interpreter reviewed:: Yes Do you feel safe going back to the place where you live?: Yes      Need for Family Participation in Patient Care: No (Comment) Care giver support system in place?: Yes (comment) Current home services: DME Criminal Activity/Legal Involvement Pertinent to Current Situation/Hospitalization: No - Comment as needed  Activities of Daily  Living Home Assistive Devices/Equipment: Eyeglasses, Grab bars around toilet, Grab bars in shower, Hand-held shower hose, Walker (specify type), Blood pressure cuff, Scales(front wheeled walker-[facility] has necessary equipment for their residents) ADL Screening (condition at time of admission) Patient's cognitive ability adequate to safely complete daily activities?: No(patient very drowsy) Is the patient deaf or have difficulty hearing?: Yes(very HOH) Does the patient have difficulty seeing, even when wearing glasses/contacts?: No Does the patient have difficulty concentrating, remembering, or making decisions?: Yes Patient able to express need for assistance with ADLs?: Yes Does the patient have difficulty dressing or bathing?: Yes Independently performs ADLs?: No Communication: Independent Dressing (OT): Needs assistance Is this a change from baseline?: Pre-admission baseline Grooming: Needs assistance Is this a change from baseline?: Pre-admission baseline Feeding: Needs assistance Is this a change from baseline?: Pre-admission baseline Bathing: Needs assistance Is this a change from baseline?: Pre-admission baseline Toileting: Needs assistance Is this a change from baseline?: Pre-admission baseline In/Out Bed: Needs assistance Is this a change from baseline?: Pre-admission baseline Walks in Home: Needs assistance Is this a change from baseline?: Pre-admission baseline Does the patient have difficulty walking or climbing stairs?: Yes(secondary to weakness) Weakness of Legs: Both Weakness of Arms/Hands: None  Permission Sought/Granted                  Emotional Assessment Appearance:: Appears stated age Attitude/Demeanor/Rapport: Engaged Affect (typically observed): Appropriate Orientation: : Oriented  to Self, Oriented to Place, Oriented to Situation, Oriented to  Time Alcohol / Substance Use: Not Applicable Psych Involvement: No (comment)  Admission  diagnosis:  Acute congestive heart failure, unspecified heart failure type (HCC) [I50.9] Acute CHF (congestive heart failure) ([place]) [I50.9] Patient Active Problem List   Diagnosis Date Noted  . Acute CHF (congestive heart failure) ([place]) 01/25/2019  . Hyponatremia 11/14/2018  . Type 2 diabetes mellitus ([place]) 11/14/2018  . Physical deconditioning 11/14/2018  . Hypertensive urgency 11/14/2018  . UTI (urinary tract infection) 11/13/2018   PCP:  [place], [place]:   [id number] [place], [place] - [address] [place] [address] Phone: [phone] Fax: [phone]     Social Determinants of Health (SDOH) Interventions    Readmission Risk Interventions No flowsheet data found.

## 2019-01-26 NOTE — Progress Notes (Signed)
Initial Nutrition Assessment  INTERVENTION:   -Ensure Enlive po BID, each supplement provides 350 kcal and 20 grams of protein  NUTRITION DIAGNOSIS:   Increased nutrient needs related to chronic illness as evidenced by estimated needs.  GOAL:   Patient will meet greater than or equal to 90% of their needs  MONITOR:   PO intake, Supplement acceptance, Labs, Weight trends, I & O's  REASON FOR ASSESSMENT:   Consult COPD Protocol  ASSESSMENT:   85 year old female who [facility] [place] with medical history of COPD, proximal A. Fib, hypertension, diabetes type 2 who presented with shortness of breath.  As per the report, she was having orthopnea, swelling of lower extremities.  **RD working remotely**  Patient reports poor appetite PTA d/t cough and SOB. Pt's symptoms now improved. Per SLP note, pt with no signs of dysphagia. Recommended regular diet with thin liquids.  Will place order for Ensure supplements while still admitted. Noted that pt to discharge tomorrow.  Per weight records, pt with increased weight. Most likely related to COPD and CHF. Pt admitted with swelling in LEs.   I/Os: -1.2L since admit UOP: 1.6L x 24 hrs  Labs reviewed. Medications: Vitamin D tablet, Vitamin C tablet  NUTRITION - FOCUSED PHYSICAL EXAM:  Working remotely.   Diet Order:   Diet Order            Diet Heart Room service appropriate? Yes; Fluid consistency: Thin  Diet effective now              EDUCATION NEEDS:   No education needs have been identified at this time  Skin:  Skin Assessment: Reviewed RN Assessment  Last BM:  12/7  Height:   Ht Readings from Last 1 Encounters:  01/25/19 5' 4.02" (1.626 m)    Weight:   Wt Readings from Last 1 Encounters:  01/26/19 87.1 kg    Ideal Body Weight:  54.5 kg  BMI:  Body mass index is 32.94 kg/m.  Estimated Nutritional Needs:   Kcal:  1650-1850  Protein:  70-80g  Fluid:  1.7L/day  Clayton Bibles, MS, RD, LDN Inpatient Clinical Dietitian Pager: [phone] After Hours Pager: [phone]

## 2019-01-26 NOTE — Evaluation (Signed)
Physical Therapy Evaluation Patient Details Name: Cheryl Graves MRN: [id number] DOB: 12/19/1928 Today's Date: 01/26/2019   History of Present Illness  Pt is88 y.o. female with medical history significant of COPD, Paroxysmal Atrial Fibrillation (recent diagnosis, placed on apixaban), HTN, T2DM who presents from nursing home with worsening shortness of breath.  Pt admitted with respiratory failure, COPD exacerbation, and possible PNE and CHF.  Clinical Impression  Pt admitted with above diagnosis. Pt required min-mod A for transfers and to ambulate 8' in room.  Pt was on 2 LPM O2 and still with dyspnea with minimal activity.  O2 sats 95% rest and 90% with minimal activity.  Pt currently with functional limitations due to the deficits listed below (see PT Problem List). Pt will benefit from skilled PT to increase their independence and safety with mobility to allow discharge to the venue listed below.       Follow Up Recommendations SNF    Equipment Recommendations  None recommended by PT    Recommendations for Other Services       Precautions / Restrictions Precautions Precautions: Fall      Mobility  Bed Mobility Overal bed mobility: Needs Assistance Bed Mobility: Supine to Sit     Supine to sit: Mod assist     General bed mobility comments: increased time; cues for bed rails  Transfers Overall transfer level: Needs assistance Equipment used: Rolling walker (2 wheeled) Transfers: Sit to/from Stand Sit to Stand: Min assist;From elevated surface         General transfer comment: cues for safe hand placement  Ambulation/Gait Ambulation/Gait assistance: Min assist Gait Distance (Feet): 8 Feet Assistive device: Rolling walker (2 wheeled) Gait Pattern/deviations: Step-to pattern;Decreased weight shift to right     General Gait Details: R knee valgus; cues for RW; pt with shortness of breath  Stairs            Wheelchair Mobility    Modified Rankin (Stroke  Patients Only)       Balance Overall balance assessment: Needs assistance Sitting-balance support: Bilateral upper extremity supported;Feet supported Sitting balance-Leahy Scale: Fair     Standing balance support: Bilateral upper extremity supported;During functional activity Standing balance-Leahy Scale: Poor                               Pertinent Vitals/Pain Pain Assessment: Faces Faces Pain Scale: Hurts a little bit Pain Location: R knee (chronic pain) Pain Descriptors / Indicators: Discomfort Pain Intervention(s): Limited activity within patient's tolerance;Repositioned    Home Living Family/patient expects to be discharged to:: Skilled nursing facility                      Prior Function Level of Independence: Needs assistance   Gait / Transfers Assistance Needed: Pt ambulated in her room at SNF with RW and supervision; used w/c for longer distances  ADL's / Homemaking Assistance Needed: Had assistance with ADLs at SNF        Hand Dominance        Extremity/Trunk Assessment   Upper Extremity Assessment Upper Extremity Assessment: Overall WFL for tasks assessed    Lower Extremity Assessment Lower Extremity Assessment: RLE deficits/detail;LLE deficits/detail RLE Deficits / Details: ROM: WFL except knee with significant valgus;  Strength 4/5 LLE Deficits / Details: ROM WFL; Strength4/5       Communication   Communication: HOH  Cognition Arousal/Alertness: Awake/alert Behavior During Therapy: WFL for tasks assessed/performed  Overall Cognitive Status: Within Functional Limits for tasks assessed                                        General Comments General comments (skin integrity, edema, etc.): on 2 LPM O2 with sats 95% rest and 90% walking    Exercises     Assessment/Plan    PT Assessment Patient needs continued PT services  PT Problem List Decreased strength;Decreased mobility;Decreased range of  motion;Decreased activity tolerance;Cardiopulmonary status limiting activity;Decreased balance       PT Treatment Interventions DME instruction;Therapeutic activities;Gait training;Therapeutic exercise;Patient/family education;Stair training;Balance training;Functional mobility training    PT Goals (Current goals can be found in the Care Plan section)  Acute Rehab PT Goals Patient Stated Goal: return to [facility] at d/c PT Goal Formulation: With patient Time For Goal Achievement: 02/09/19 Potential to Achieve Goals: Good    Frequency Min 2X/week   Barriers to discharge        Co-evaluation               AM-PAC PT "6 Clicks" Mobility  Outcome Measure Help needed turning from your back to your side while in a flat bed without using bedrails?: A Lot Help needed moving from lying on your back to sitting on the side of a flat bed without using bedrails?: A Lot Help needed moving to and from a bed to a chair (including a wheelchair)?: A Little Help needed standing up from a chair using your arms (e.g., wheelchair or bedside chair)?: A Little Help needed to walk in hospital room?: A Little Help needed climbing 3-5 steps with a railing? : Total 6 Click Score: 14    End of Session Equipment Utilized During Treatment: Gait belt;Oxygen Activity Tolerance: Patient tolerated treatment well Patient left: in chair;with chair alarm set;with call bell/phone within reach Nurse Communication: Mobility status PT Visit Diagnosis: Unsteadiness on feet (R26.81);Muscle weakness (generalized) (M62.81)    Time: 8546-2703 PT Time Calculation (min) (ACUTE ONLY): 24 min   Charges:   PT Evaluation $PT Eval Moderate Complexity: 1 Mod          Royetta Asal, PT Acute Rehab Services Pager [phone] [facility] Re[facility] [phone] [facility] [phone]   Rayetta Humphrey 01/26/2019, 4:39 PM

## 2019-01-27 ENCOUNTER — Encounter (HOSPITAL_COMMUNITY): Payer: Self-pay

## 2019-01-27 ENCOUNTER — Inpatient Hospital Stay (HOSPITAL_COMMUNITY): Payer: Medicare Other

## 2019-01-27 DIAGNOSIS — I63419 Cerebral infarction due to embolism of unspecified middle cerebral artery: Secondary | ICD-10-CM

## 2019-01-27 DIAGNOSIS — R531 Weakness: Secondary | ICD-10-CM

## 2019-01-27 DIAGNOSIS — Z7189 Other specified counseling: Secondary | ICD-10-CM

## 2019-01-27 DIAGNOSIS — Z515 Encounter for palliative care: Secondary | ICD-10-CM

## 2019-01-27 LAB — BASIC METABOLIC PANEL
Anion gap: 12 (ref 5–15)
BUN: 42 mg/dL — ABNORMAL HIGH (ref 8–23)
CO2: 31 mmol/L (ref 22–32)
Calcium: 8.1 mg/dL — ABNORMAL LOW (ref 8.9–10.3)
Chloride: 95 mmol/L — ABNORMAL LOW (ref 98–111)
Creatinine, Ser: 1.26 mg/dL — ABNORMAL HIGH (ref 0.44–1.00)
GFR calc Af Amer: 44 mL/min — ABNORMAL LOW (ref >60)
GFR calc non Af Amer: 38 mL/min — ABNORMAL LOW (ref >60)
Glucose, Bld: 135 mg/dL — ABNORMAL HIGH (ref 70–99)
Potassium: 3.3 mmol/L — ABNORMAL LOW (ref 3.5–5.1)
Sodium: 138 mmol/L (ref 135–145)

## 2019-01-27 LAB — RESPIRATORY PANEL BY PCR

## 2019-01-27 LAB — BLOOD GAS, ARTERIAL
Acid-Base Excess: 6.8 mmol/L — ABNORMAL HIGH (ref 0.0–2.0)
Bicarbonate: 29.4 mmol/L — ABNORMAL HIGH (ref 20.0–28.0)
Drawn by: 331471
O2 Saturation: 98.5 %
Patient temperature: 98.6
pCO2 arterial: 34.5 mmHg (ref 32.0–48.0)
pH, Arterial: 7.54 — ABNORMAL HIGH (ref 7.350–7.450)
pO2, Arterial: 102 mmHg (ref 83.0–108.0)

## 2019-01-27 LAB — PROCALCITONIN: Procalcitonin: <0.1 ng/mL

## 2019-01-27 LAB — GLUCOSE, CAPILLARY: Glucose-Capillary: 318 mg/dL — ABNORMAL HIGH (ref 70–99)

## 2019-01-27 MED ORDER — FUROSEMIDE 10 MG/ML IJ SOLN
INTRAMUSCULAR | Status: AC
  Filled 2019-01-27: qty 4

## 2019-01-27 MED ORDER — ASPIRIN EC 81 MG PO TBEC
81.0000 mg | DELAYED_RELEASE_TABLET | Freq: Every day | ORAL | Status: DC
  Administered 2019-01-29 – 2019-01-30 (×2): 81 mg via ORAL
  Filled 2019-01-27 (×2): qty 1

## 2019-01-27 MED ORDER — SODIUM CHLORIDE 0.9 % IV SOLN: INTRAVENOUS | Status: DC

## 2019-01-27 MED ORDER — LEVALBUTEROL HCL 0.63 MG/3ML IN NEBU
0.6300 mg | INHALATION_SOLUTION | Freq: Four times a day (QID) | RESPIRATORY_TRACT | Status: DC
  Administered 2019-01-27 (×2): 0.63 mg via RESPIRATORY_TRACT
  Filled 2019-01-27 (×2): qty 3

## 2019-01-27 MED ORDER — LEVALBUTEROL HCL 0.63 MG/3ML IN NEBU
0.6300 mg | INHALATION_SOLUTION | Freq: Three times a day (TID) | RESPIRATORY_TRACT | Status: DC
  Administered 2019-01-28 – 2019-01-30 (×7): 0.63 mg via RESPIRATORY_TRACT
  Filled 2019-01-27 (×7): qty 3

## 2019-01-27 MED ORDER — BUDESONIDE 0.25 MG/2ML IN SUSP
0.2500 mg | Freq: Two times a day (BID) | RESPIRATORY_TRACT | Status: DC
  Administered 2019-01-27 – 2019-01-30 (×7): 0.25 mg via RESPIRATORY_TRACT
  Filled 2019-01-27 (×6): qty 2

## 2019-01-27 MED ORDER — IOHEXOL 350 MG/ML SOLN
75.0000 mL | Freq: Once | INTRAVENOUS | Status: AC | PRN
  Administered 2019-01-27: 75 mL via INTRAVENOUS

## 2019-01-27 MED ORDER — SODIUM CHLORIDE (PF) 0.9 % IJ SOLN
INTRAMUSCULAR | Status: AC
  Filled 2019-01-27: qty 50

## 2019-01-27 MED ORDER — PREDNISONE 20 MG PO TABS
40.0000 mg | ORAL_TABLET | Freq: Every day | ORAL | Status: DC
  Administered 2019-01-29 – 2019-01-30 (×2): 40 mg via ORAL
  Filled 2019-01-27 (×2): qty 2

## 2019-01-27 MED ORDER — FUROSEMIDE 10 MG/ML IJ SOLN: 40.0000 mg | Freq: Two times a day (BID) | INTRAMUSCULAR | Status: DC

## 2019-01-27 MED ORDER — ONDANSETRON HCL 4 MG/2ML IJ SOLN: 4.0000 mg | Freq: Four times a day (QID) | INTRAMUSCULAR | Status: DC | PRN

## 2019-01-27 MED ORDER — HYDROMORPHONE HCL 1 MG/ML IJ SOLN
0.5000 mg | INTRAMUSCULAR | Status: DC | PRN
  Administered 2019-01-27 – 2019-01-28 (×3): 0.5 mg via INTRAVENOUS
  Filled 2019-01-27 (×3): qty 0.5

## 2019-01-27 MED ORDER — IPRATROPIUM-ALBUTEROL 0.5-2.5 (3) MG/3ML IN SOLN: 3.0000 mL | Freq: Four times a day (QID) | RESPIRATORY_TRACT | Status: DC | PRN

## 2019-01-27 MED ORDER — POTASSIUM CHLORIDE CRYS ER 20 MEQ PO TBCR
40.0000 meq | EXTENDED_RELEASE_TABLET | ORAL | Status: AC
  Administered 2019-01-27 (×2): 40 meq via ORAL
  Filled 2019-01-27 (×2): qty 2

## 2019-01-27 MED ORDER — FUROSEMIDE 10 MG/ML IJ SOLN
40.0000 mg | Freq: Every day | INTRAMUSCULAR | Status: DC
  Administered 2019-01-27 – 2019-01-30 (×4): 40 mg via INTRAVENOUS
  Filled 2019-01-27 (×3): qty 4

## 2019-01-27 NOTE — Significant Event (Addendum)
CT  Head showed hyperdense left MCA compatible with acute embolus.CT angio of head and neck showed left M2 proximal branch occlusion.I talked to Dr Cheral Marker and there was a plan  for transfer to [facility] for urgent IR intervention. I called the niece and updated the current events. I discussed goals of care with her and changed the code status to DNR. Neurology,Dr Cheral Marker also called the niece. The niece doesnot want any intervention for her aunt. We have requested for palliative care consultation. At this point, I think hospice care is appropriate for her and the niece agrees with this. She was grateful to [place] for what we have done to her aunt so far.

## 2019-01-27 NOTE — Progress Notes (Signed)
Pt has poor effort at doing her inhalers, MD aware.

## 2019-01-27 NOTE — Significant Event (Signed)
I was notified about the development  of right hemiparesis and facial droop by the RN.  Patient seen and examined at the bedside.  She is hemodynamically stable.  She has developed acute left facial droop and right hemiparesis.  This was not present in the morning. Code stroke called.  Neurology will be consulted.  Stat CT head will be done.  If CT does not show anything, will do MRI

## 2019-01-27 NOTE — Significant Event (Signed)
Rapid Response Event Note  Overview: Time Called: 0818 Arrival Time: 0820 Event Type: Neurologic  Initial Focused Assessment: Pt resting in bed with clear R side facial droop. Able to state name but speech is severely garbled. Asymmetrical smile. Unable to hold R arm up. Able to hold L arm up. R arm flinch with pain but unable to fully withdraw. Able to wiggle R toes but not lift leg.   Interventions: CBG 318. Code Stroke called. Pt transferred with rapid RN and bedside RN to CT. Tele neuro consulted. MD to bedside. CT Head, CTA Head & Neck, MRI completed.   Plan of Care (if not transferred):  Event Summary: Pt transported back to 1517 with Rapid RN. Bed requested for [facility].   Wray Kearns

## 2019-01-27 NOTE — Consult Note (Signed)
[facility] TeleNeurology Consult Services   Date of Service:   01/27/2019 13:57:13  Impression:     .  B51.025 - Cerebrovascular accident (CVA) due to embolism of left middle cerebral artery (HCCC)  Comments/Sign-Out: Severe left MCA syndrome, mixed aphasia and right hemiparesis. No IV Alteplase due to ongoing use of Apixaban. CTA head and neck ordered, if LVO will need to be transferred.  Metrics: Last Known Well: 01/27/2019 12:30:00 [facility] Notification Time: 01/27/2019 13:57:13 Stamp Time: 01/27/2019 13:57:13 Time First Login Attempt: 01/27/2019 14:01:20 Video Start Time: 01/27/2019 14:01:40  Symptoms: speech impairment and right sided weakness NIHSS Start Assessment Time: 01/27/2019 14:05:45 Patient is not a candidate for Alteplase/Activase. Patient was not deemed candidate for Alteplase/Activase thrombolytics because of Use of NOAs within 48 hours. Video End Time: 01/27/2019 14:09:08  CT head showed no acute hemorrhage or acute core infarct.  Clinical Presentation is Suggestive of Large Vessel Occlusive Disease, Recommendations are as Follows  CTA Head and Neck. CT Perfusion.   Our recommendations are outlined below.  Recommendations:     .  Activate Stroke Protocol Admission/Order Set     .  Stroke/Telemetry Floor     .  Neuro Checks     .  Bedside Swallow Eval     .  DVT Prophylaxis     .  IV Fluids, Normal Saline     .  Head of Bed 30 Degrees     .  Euglycemia and Avoid Hyperthermia (PRN Acetaminophen)  Routine Consultation with [facility] for Follow up Care  Sign Out:     .  Discussed with Primary Attending    ------------------------------------------------------------------------------  History of Present Illness: Patient is a 85 year old Female.  Inpatient stroke alert was called for symptoms of speech impairment and right sided weakness  In house for CHF, known AF on oral anticoagulation. Around 1230 abrupt  onset inability to speak and right sided weakness. No LOC. baseline functional neurological status unknown to me. Received Apixaban today (within past 6 hours). Patient can not provide information due to apahsia.   Past Medical History:     . Hypertension     . Diabetes Mellitus     . Hyperlipidemia     . Atrial Fibrillation  Anticoagulant use:  apixaban     Examination: BP(144/83), Pulse(103), Blood Glucose(318) Vitals with BMI 01/27/2019 01/26/2019 01/26/2019  Height - - -  Weight 191 lbs 6 oz - -  BMI 85.27 - -  Systolic 782 423 536  Diastolic 83 86 67  Pulse 144 - 76   1A: Level of Consciousness - Alert; keenly responsive + 0 1B: Ask Month and Age - Aphasic + 2 1C: Blink Eyes & Squeeze Hands - Performs 0 Tasks + 2 2: Test Horizontal Extraocular Movements - Partial Gaze Palsy: Corrects with Oculocephalic Reflex + 1 3: Test Visual Fields - Partial Hemianopia + 1 4: Test Facial Palsy (Use Grimace if Obtunded) - Partial paralysis (lower face) + 2 5A: Test Left Arm Motor Drift - No Drift for 10 Seconds + 0 5B: Test Right Arm Motor Drift - No Movement + 4 6A: Test Left Leg Motor Drift - No Drift for 5 Seconds + 0 6B: Test Right Leg Motor Drift - Some Effort Against Gravity + 2 7: Test Limb Ataxia (FNF/Heel-Shin) - No Ataxia + 0 8: Test Sensation - Mild-Moderate Loss: Less Sharp/More Dull + 1 9: Test Language/Aphasia - Mute/Global Aphasia: No Usable Speech/Auditory Comprehension + 3 10: Test Dysarthria -  Mute/Anarthric + 2 11: Test Extinction/Inattention - Profound hemi-inattention (ex: does not recognize own hand) + 2  NIHSS Score: 22   Patient/Family was informed the Neurology Consult would happen via TeleHealth consult by way of interactive audio and video telecommunications and consented to receiving care in this manner.   Due to the immediate potential for life-threatening deterioration due to underlying acute neurologic illness, I spent 20 minutes providing critical  care. This time includes time for face to face visit via telemedicine, review of medical records, imaging studies and discussion of findings with providers, the patient and/or family.   Dr Elige Radon   [facility] [phone]   Case [id number]

## 2019-01-27 NOTE — Evaluation (Signed)
Clinical/Bedside Swallow Evaluation Patient Details  Name: Cheryl Graves MRN: [id number] Date of Birth: Jul 17, 1928  Today's Date: 01/27/2019 Time: SLP Start Time (ACUTE ONLY): 1639 SLP Stop Time (ACUTE ONLY): 1715 SLP Time Calculation (min) (ACUTE ONLY): 36 min  Past Medical History:  Past Medical History:  Diagnosis Date  . Diabetes mellitus without complication (HCC)   . Diverticulitis   . Hypertension    Past Surgical History:  Past Surgical History:  Procedure Laterality Date  . APPENDECTOMY    . TONSILLECTOMY     HPI:  85 yo female with h/o HTN, DM [facility] with coughing and difficulty breathing.  Pt COVID negative, CXR showed pulmonary edema. Pt admits to h/o GERD for which she takes reflux medications religiously.  Pt with change in mentation today- right facial droop and decreased right are movement.  Code stroke called - Pt found to have Acute infarct in the deep white matter on the left. Small areas of acute infarct in the left parietal cortex over the convexity.  Swallow evaluation ordered.   Assessment / Plan / Recommendation Clinical Impression  At this time pt presents with significant cranial nerve deficits including facial/trigeminal nerve involvement.  She is weak with signficant aphasia.  Pt desires to drink however as she is thirsty per her neice Cheryl Graves.  SLP provided pt with minimal moisture via toothette.  Delayed swallow followed by dyspnea and mild congestion with breathing.  Pt then closed her eyes and became mildly agitated stating  "... the bread" - able to articulate difficulty with breathing. SLP obtained vitamat with pt's oxygen saturations in 90s and HR varying from 40's to 160's.  RN arrived and provided pt with Dilaudid. SLP educated pt's niece to recommendations for NPO except minimal oral moisture.  Pt left in room calm and sleeping. SLP Visit Diagnosis: Dysphagia, oropharyngeal phase (R13.12)    Aspiration Risk  Risk  for inadequate nutrition/hydration;Severe aspiration risk    Diet Recommendation NPO        Other  Recommendations     Follow up Recommendations None      Frequency and Duration min 1 x/week  1 week       Prognosis    Poor for swallowing     Swallow Study   General Date of Onset: 01/26/19 HPI: 85 yo female with h/o HTN, DM admitted [facility] with coughing and difficulty breathing.  Pt COVID negative, CXR showed pulmonary edema. Pt admits to h/o GERD for which she takes reflux medications religiously.  Pt with change in mentation today- right facial droop and decreased right are movement.  Code stroke called - Pt found to have Acute infarct in the deep white matter on the left. Small areas of acute infarct in the left parietal cortex over the convexity.  Swallow evaluation ordered. Type of Study: Bedside Swallow Evaluation Previous Swallow Assessment: none in the system Respiratory Status: Nasal cannula History of Recent Intubation: No Behavior/Cognition: Alert;Cooperative;Pleasant mood Oral Cavity Assessment: Dry Oral Care Completed by SLP: Yes Oral Cavity - Dentition: Dentures, top(partial lower) Vision: Functional for self-feeding Baseline Vocal Quality: Breathy;Low vocal intensity Volitional Cough: Cognitively unable to elicit Volitional Swallow: Unable to elicit    Oral/Motor/Sensory Function Overall Oral Motor/Sensory Function: Moderate impairment Facial ROM: Suspected CN VII (facial) dysfunction;Reduced right Facial Symmetry: Suspected CN VII (facial) dysfunction;Abnormal symmetry right Facial Strength: Reduced right;Suspected CN VII (facial) dysfunction Facial Sensation: (DNT) Lingual ROM: Within Functional Limits Lingual Symmetry:  Within Functional Limits Lingual Strength: Reduced;Suspected CN XII (hypoglossal) dysfunction Lingual Sensation: Within Functional Limits Velum: Other (comment)(DNT)   Ice Chips Ice chips: Not tested   Thin  Liquid Thin Liquid: Impaired Other Comments: oral moisture via toothette provided, pt with delayed swallow and post-swallow increased WOB, pt repeatedly stated "get the bread" = finally was able to articulate she was dyspneic, no further po trials provided    Nectar Thick Nectar Thick Liquid: Not tested   Honey Thick Honey Thick Liquid: Not tested   Puree Puree: Not tested   Solid     Solid: Not tested      Cheryl Graves 01/27/2019,5:47 PM   Cheryl Graves, [place] [facility] SLP Acute Rehab Services Pager [phone] Office [phone]

## 2019-01-27 NOTE — Progress Notes (Signed)
This Probation officer was performing intentional rounding on patient when RN noticed that patient was having difficulty completing sentences and had a right sided facial droop. RN then performed a neurological assessment on patient and noticed right upper and lower extremity weakness. Rapid response and MD notified. CBG obtained and was 318. Patient transferred to CT and MRI which revealed stroke. Niece was notified by physician. Orders obtained for DNR. Patient now has palliative following.

## 2019-01-27 NOTE — Consult Note (Signed)
Consultation Note Date: 01/27/2019   Patient Name: Cheryl Graves  DOB: 1928/10/23  MRN: [id number]  Age / Sex: 85 y.o., female  PCP: [facility] Referring Physician: Shelly Coss, MD  Reason for Consultation: Establishing goals of care  HPI/Patient Profile: 85 y.o. female  admitted on 01/25/2019    Clinical Assessment and Goals of Care: 85 year old lady who was living in [place], [place] by herself up until the last couple of months.  2 months ago, she was placed at [facility] in [place], [place] by her niece because of decline.  Patient has past medical history significant for COPD, atrial fibrillation, hypertension, diabetes.  Patient follows with Dr. Halford Chessman from pulmonary in the outpatient setting.  At baseline, she is on home inhalers as well as 2 L of supplemental oxygen via nasal cannula. Patient was admitted to hospital medicine service with acute on chronic hypoxic respiratory failure deemed secondary to chronic obstructive pulmonary disease as well as acute heart failure exacerbation.  However, her hospital course was complicated by development of an acute stroke here in the hospital today.   Patient developed right-sided hemiparesis and facial droop.  Code stroke was called and neurology was consulted.  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  It was noted that initial plan was for the patient to be transferred to [facility] for urgent IR interventions for possible left M2 proximal branch occlusion. After further discussions, it was noted that the patient's family would prefer a more palliative/comfort focused approach to her care.  Hence, palliative consultation was called.  Patient is awake but not alert.  She is confused.  She is having slurred speech.  She is having a  little bit of difficulty managing her own salivary secretions.  She appears to be nauseous as well.  Her niece Cheryl Graves is present at the bedside.  I introduced myself and palliative care as follows:  Palliative medicine is specialized medical care for people living with serious illness. It focuses on providing relief from the symptoms and stress of a serious illness. The goal is to improve quality of life for both the patient and the family.  Goals of care: Broad aims of medical therapy in relation to the patient's values and preferences. Our aim is to provide medical care aimed at enabling patients to achieve the goals that matter most to them, given the circumstances of their particular medical situation and their constraints.   I discussed with the patient's niece who is present at the bedside that the nature of my visit is to give her further information about comfort measures, about hospice philosophy of care, particularly residential hospice.  Patient's other niece is coming from [place].  Patient's niece Cheryl Graves who is present at the bedside would like to wait until tomorrow morning with additional family members being present to have further goals of care discussions.  She wishes to continue current mode of care for now.  Offered  active listening and supportive care.  Answered all of her questions to the best of my ability.  HCPOA 2 nieces, 1 niece Cheryl Graves is present at the bedside.  Patient does not have adult children does not have a spouse.  SUMMARY OF RECOMMENDATIONS   Agree with DO NOT RESUSCITATE. Recommend comfort measures, residential hospice.  Niece Cheryl Graves who is present at the bedside currently would like to wait until tomorrow 12-11 to discuss further when additional family members arrive from out of town. Tentatively, a family meeting for further goals of care discussions has been scheduled for 01-28-2019 at 11 AM.  Niece Cheryl Graves present at the bedside also wishes to  get an update from neurology again, with additional family members present.  Palliative medicine team to follow.  Thank you for the consult.  Code Status/Advance Care Planning:  DNR    Symptom Management:    Added Zofran IV PRN  Added Dilaudid IV PRN.   Palliative Prophylaxis:   Delirium Protocol   Psycho-social/Spiritual:   Desire for further Chaplaincy support:yes  Additional Recommendations: Education on Hospice  Prognosis:   Unable to determine  Discharge Planning: To Be Determined      Primary Diagnoses: Present on Admission: **None**   I have reviewed the medical record, interviewed the patient and family, and examined the patient. The following aspects are pertinent.  Past Medical History:  Diagnosis Date  . Diabetes mellitus without complication ([facility])   . Diverticulitis   . Hypertension    Social History   Socioeconomic History  . Marital status: Single    Spouse name: Not on file  . Number of children: Not on file  . Years of education: Not on file  . Highest education level: Not on file  Occupational History  . Not on file  Tobacco Use  . Smoking status: Former Smoker    Quit date: 11/20/2018    Years since quitting: 0.1  . Smokeless tobacco: Never Used  Substance and Sexual Activity  . Alcohol use: No  . Drug use: No  . Sexual activity: Not on file  Other Topics Concern  . Not on file  Social History Narrative  . Not on file   Social Determinants of Health   Financial Resource Strain:   . Difficulty of Paying Living Expenses: Not on file  Food Insecurity:   . Worried About Charity fundraiser in the Last Year: Not on file  . Ran Out of Food in the Last Year: Not on file  Transportation Needs:   . Lack of Transportation (Medical): Not on file  . Lack of Transportation (Non-Medical): Not on file  Physical Activity:   . Days of Exercise per Week: Not on file  . Minutes of Exercise per Session: Not on file  Stress:   . Feeling  of Stress : Not on file  Social Connections:   . Frequency of Communication with Friends and Family: Not on file  . Frequency of Social Gatherings with Friends and Family: Not on file  . Attends Religious Services: Not on file  . Active Member of Clubs or Organizations: Not on file  . Attends Archivist Meetings: Not on file  . Marital Status: Not on file   History reviewed. No pertinent family history. Scheduled Meds: . [START ON 01/28/2019] aspirin EC  81 mg Oral Daily  . azithromycin  500 mg Oral Daily  . budesonide (PULMICORT) nebulizer solution  0.25 mg Nebulization BID  . cholecalciferol  1,000  Units Oral Daily  . insulin aspart  10 Units Intravenous Once   And  . dextrose  1 ampule Intravenous Once  . feeding supplement (ENSURE ENLIVE)  237 mL Oral BID BM  . furosemide      . furosemide  40 mg Intravenous Daily  . hydrALAZINE  50 mg Oral TID  . levalbuterol  0.63 mg Nebulization Q6H  . lisinopril  40 mg Oral Daily  . Melatonin  2 mg Oral QHS  . metoprolol tartrate  12.5 mg Oral BID  . mirabegron ER  25 mg Oral Daily  . pantoprazole  40 mg Oral BID  . [START ON 01/28/2019] predniSONE  40 mg Oral Q breakfast  . vitamin C  1,000 mg Oral Daily   Continuous Infusions: . sodium chloride Stopped (01/25/19 2100)  . cefTRIAXone (ROCEPHIN)  IV 1 g (01/26/19 2125)   PRN Meds:.acetaminophen, dextromethorphan, dextromethorphan-guaiFENesin, HYDROmorphone (DILAUDID) injection, ondansetron (ZOFRAN) IV, ondansetron Medications Prior to Admission:  Prior to Admission medications   Medication Sig Start Date End Date Taking? Authorizing Provider  acetaminophen (TYLENOL) 325 MG tablet Take 650 mg by mouth every 6 (six) hours as needed for moderate pain.   Yes [provider]  apixaban (ELIQUIS) 2.5 MG TABS tablet Take 1 tablet (2.5 mg total) by mouth 2 (two) times daily. 12/21/18  Yes Sherran Needs, NP  cholecalciferol (VITAMIN D) 25 MCG (1000 UT) tablet Take 1,000  Units by mouth daily.   Yes [provider]  COMBIVENT RESPIMAT 20-100 MCG/ACT AERS respimat Inhale 1-2 puffs into the lungs every 6 (six) hours as needed for shortness of breath or wheezing. 01/20/19  Yes [provider]  Cyanocobalamin (VITAMIN DEFICIENCY SYSTEM-B12) 1000 MCG/ML KIT Inject 1,000 mcg as directed every 30 (thirty) days.   Yes [provider]  dextromethorphan (DELSYM) 30 MG/5ML liquid Take 60 mg by mouth every 12 (twelve) hours as needed for cough.   Yes [provider]  dextromethorphan-guaiFENesin (MUCINEX DM) 30-600 MG 12hr tablet Take 1 tablet by mouth every 12 (twelve) hours as needed for cough.    Yes [provider]  hydrALAZINE (APRESOLINE) 50 MG tablet Take 1 tablet (50 mg total) by mouth 3 (three) times daily. 11/18/18  Yes Mariel Aloe, MD  ivermectin (STROMECTOL) 3 MG TABS tablet Take 1.5 mg by mouth once a week. 01/20/19  Yes [provider]  lisinopril (ZESTRIL) 40 MG tablet Take 1 tablet (40 mg total) by mouth daily. 11/19/18  Yes Mariel Aloe, MD  Melatonin 1 MG TABS Take 2 mg by mouth at bedtime.   Yes [provider]  metoprolol tartrate (LOPRESSOR) 25 MG tablet Take 0.5 tablets (12.5 mg total) by mouth 2 (two) times daily. 12/14/18  Yes Carlisle Cater, PA-C  mirabegron ER (MYRBETRIQ) 25 MG TB24 tablet Take 1 tablet (25 mg total) by mouth daily. 11/18/18  Yes Mariel Aloe, MD  ondansetron (ZOFRAN) 4 MG tablet Take 4 mg by mouth every 8 (eight) hours as needed for nausea or vomiting.   Yes [provider]  pantoprazole (PROTONIX) 40 MG tablet Take 40 mg by mouth 2 (two) times daily.   Yes [provider]  sodium chloride 1 g tablet Take 1 g by mouth 3 (three) times daily.   Yes [provider]  Tiotropium Bromide Monohydrate (SPIRIVA RESPIMAT) 2.5 MCG/ACT AERS Inhale 2 puffs into the lungs daily. 01/06/19  Yes Chesley Mires, MD  traZODone (DESYREL) 50 MG tablet Take 25 mg by  mouth at bedtime.    Yes [provider]  vitamin C (ASCORBIC ACID) 500 MG tablet Take 1,000 mg by mouth daily.   Yes [provider]  zinc gluconate 50 MG tablet Take 50 mg by mouth daily.   Yes [provider]   Allergies  Allergen Reactions  . Ciprofloxacin Diarrhea and Nausea Only  . Metronidazole Diarrhea and Nausea Only  . Septra [Sulfamethoxazole-Trimethoprim] Diarrhea   Review of Systems Patient is confused. Patient is slurring her words Patient appears to be having some nausea  Physical Exam Weak appearing elderly lady resting in bed Diminished breath sounds Mild facial droop Generalized weakness Patient is awake but confused Patient has slurring of her words Abdomen is not distended No edema  Vital Signs: BP (!) 147/120 (BP Location: Right Arm)   Pulse (!) 114   Temp (!) 97.4 F (36.3 C)   Resp (!) 21   Ht 5' 4.02" (1.626 m)   Wt 86.8 kg   SpO2 96%   BMI 32.83 kg/m  Pain Scale: 0-10   Pain Score: 0-No pain   SpO2: SpO2: 96 % O2 Device:SpO2: 96 % O2 Flow Rate: .O2 Flow Rate (L/min): 2 L/min  IO: Intake/output summary:   Intake/Output Summary (Last 24 hours) at 01/27/2019 1621 Last data filed at 01/27/2019 1216 Gross per 24 hour  Intake 240 ml  Output 2500 ml  Net -2260 ml    LBM: Last BM Date: 01/25/19 Baseline Weight: Weight: 72.3 kg Most recent weight: Weight: 86.8 kg     Palliative Assessment/Data:   PPS 30%  Time In:  1515 Time Out:  1615 Time Total:  60  Greater than 50%  of this time was spent counseling and coordinating care related to the above assessment and plan.  Signed by: Loistine Chance, MD   Please contact Palliative Medicine Team phone at [phone] for questions and concerns.  For individual provider: See Shea Evans

## 2019-01-27 NOTE — Progress Notes (Signed)
When RT went  in room to give breathing tx pt was labored, increase hr and rr. Rt gave neb and notified RN. RN called rapid response.

## 2019-01-27 NOTE — Significant Event (Signed)
Rapid Response Event Note  Overview: Time Called: 0818 Arrival Time: 0820 Event Type: Respiratory  Initial Focused Assessment:  RRT called due to increased respiratory effort, and respiratory distress. Respiratory therapy at bedside giving nebulizer, patient grunting with respirations and unable to form full sentences due to labored breathing. RR 26, HR 142, BP WNL. Heart rhythm in known Afib. Crackles and rhonchi heard on exam. Patient alert and oriented. Inhalers attempted but patient unable to comply due to respiratory effort. Patient has no chest pain, only complaint is shortness of breath. MD paged and spoke with on phone. Steroids given as ordered, 12 lead EKG completed, chest Xray, and ABG done. MD at bedside. Plan to make NPO until aspiration event is ruled out. IV lasix given as prescribed. Plan for scheduled IV lasix, and DuoNeb Q6 as ordered. MD to update patient niece. HR decreased to 119. BP 149/89. RR 18. Plan to remain telemetry level of care. Please call if anything changes.    Event Summary: Name of Physician Notified: Dessie Coma MD at [phone]    at    Outcome: Stayed in room and stabalized, Other (Comment)(MD at bedside)  Event End Time: 0900  Brittinee Risk A Jolly Bleicher

## 2019-01-27 NOTE — Progress Notes (Signed)
Respiratory therapist, Lattie Haw, came to get this writer to inform me of the patient having labored breathing, tachycardia, not being able to complete sentences due to shortness of breath, and overall not looking well. Rapid response and MD notified of change in patient condition. EKG obtained. IV solumedrol and IV lasix given.

## 2019-01-27 NOTE — Progress Notes (Signed)
PROGRESS NOTE    Cheryl Graves  ZOX:[id number] DOB: 27-Sep-1928 DOA: 01/25/2019 PCP: [name]   Brief Narrative:  Patient is a 85 year old female who is from [facility] in a [place] with medical history of COPD, proximal A. Fib, hypertension, diabetes type 2 who presented with shortness of breath.  As per the report, she was having orthopnea, swelling of lower extremities.  She follows with [name] as an outpatient for her COPD.  She is on inhalers at home and on 2 L of oxygen via nasal cannula.  Patient was also noted to be tachycardic at that nursing facility.  On emergency department ,chest x-ray showed possible congestive heart failure with bilateral pleural effusions.  She was started on IV Lasix.  Also started on broad start antibiotics for possible pneumonia. She was doing okay but this morning rapid response was called because of the respiratory distress.  Chest x-ray showed possible congestion.  Plan to discharge back to her facility when her respiratory status improves.  Assessment & Plan:   Active Problems:   Acute CHF (congestive heart failure) (HCC)   Acute on chronic hypoxic respiratory failure: Secondary to COPD exacerbation but could be also due to pneumonia or new onset diastolic CHF.  Currently she is on 2 L of oxygen per minute which is her baseline.  She went into respiratory distress this morning.  Chest x-ray again  showed some pleural effusions.  Possible diastolic CHF: Echocardiogram showed normal left ventricular ejection fraction, mildly reduced right ventricular systolic function.  She was complaining of orthopnea and bilateral lower extremity edema.    I will continue IV Lasix for now  . Continue to monitor daily weight, input/output. On reviewing her chest x-ray, she does not have significant pleural effusion for thoracentesis.  Continue IV diuretic treatment for now.Change to oral when appropriate.  COPD exacerbation:  Follows with [name], pulmonology.  On 2 L of oxygen at baseline.  Continue bronchodilators as needed.  She is on inhalers at home.Started on steroids.  Possible community-acquired pneumonia: Chest x-ray on presentation could not  out pneumonia.  She is afebrile, no leukocytosis.  Continue current antibiotics for now.  CKD stage IIIa: Creatinine ranges from 1.2-1.5.  Currently kidney function at baseline.  Paroxysmal A. fib: Rate controlled.  On Eliquis.  On metoprolol for rate control.  Hypertension: Currently  stable.  Continue lisinopril, hydralazine, metoprolol  Diabetes type 2: Last hemoglobin C 5.7.  Not on oral agents at home.  Continue sliding-scale insulin   Hyponatremia: Currently stable  GERD: Continue PPI  Advanced age/debility/deconditioning: Resides in assisted living facility.  PT/OT evaluation done and recommended SNF.  Nutrition Problem: Increased nutrient needs Etiology: chronic illness      DVT prophylaxis:Eilquis Code Status: Full Family Communication:Called niece on phone 01/26/19 Disposition Plan: Discharge back to SNF when respiratory status remains stable   Consultants: None  Procedures: None  Antimicrobials:  Anti-infectives (From admission, onward)   Start     Dose/Rate Route Frequency Ordered Stop   01/26/19 1800  azithromycin (ZITHROMAX) tablet 500 mg     500 mg Oral Daily 01/25/19 1728 01/30/19 0959   01/25/19 2000  cefTRIAXone (ROCEPHIN) 1 g in sodium chloride 0.9 % 100 mL IVPB     1 g 200 mL/hr over 30 Minutes Intravenous Every 24 hours 01/25/19 1728     01/25/19 1800  azithromycin (ZITHROMAX) 500 mg in sodium chloride 0.9 % 250 mL IVPB     500 mg  250 mL/hr over 60 Minutes Intravenous Every 24 hours 01/25/19 1728 01/25/19 2100      Subjective: Patient seen and examined the bedside this morning.  Rapid response was called early this morning because of respiratory distress.  She was tachypneic but maintaining her oxygenation on 2 L of  oxygen.  ABG did not show hypoxia.  Chest x-ray was similar to last time showing some pleural effusion, congestion.  Later this morning, her respiratory status improved.  Objective: Vitals:   01/26/19 2120 01/27/19 0500 01/27/19 0624 01/27/19 0824  BP: (!) 149/86  (!) 144/83   Pulse:   (!) 103   Resp:   20   Temp:   (!) 97.4 F (36.3 C)   TempSrc:      SpO2:   98% 96%  Weight:  86.8 kg    Height:        Intake/Output Summary (Last 24 hours) at 01/27/2019 1301 Last data filed at 01/27/2019 1216 Gross per 24 hour  Intake 240 ml  Output 2500 ml  Net -2260 ml   Filed Weights   01/25/19 1813 01/26/19 0541 01/27/19 0500  Weight: 72.3 kg 87.1 kg 86.8 kg    Examination:  General exam: Pleasant elderly female, comfortable  HEENT:PERRL,Oral mucosa moist, Ear/Nose normal on gross exam Respiratory system: Bilateral diminished breath sounds on the bases,rhonci, crackles on the right base Cardiovascular system: Afib. No JVD, murmurs, rubs, gallops or clicks. No pedal edema. Gastrointestinal system: Abdomen is nondistended, soft and nontender. No organomegaly or masses felt. Normal bowel sounds heard. Central nervous system: Alert and oriented. No focal neurological deficits. Extremities: No edema, no clubbing ,no cyanosis, distal peripheral pulses palpable. Skin: No rashes, lesions or ulcers,no icterus ,no pallor      Data Reviewed: I have personally reviewed following labs and imaging studies  CBC: Recent Labs  Lab 01/25/19 1039 01/25/19 1752 01/26/19 0642  WBC 9.0 5.1 8.7  NEUTROABS 7.4  --  6.8  HGB 11.6* 11.4* 10.8*  HCT 37.2 36.8 35.1*  MCV 94.2 94.8 94.4  PLT 329 310 307   Basic Metabolic Panel: Recent Labs  Lab 01/25/19 1039 01/25/19 1752 01/26/19 0642 01/27/19 0612  NA 137  --  138 138  K 5.4*  --  4.0 3.3*  CL 103  --  97* 95*  CO2 25  --  29 31  GLUCOSE 194*  --  144* 135*  BUN 21  --  27* 42*  CREATININE 1.16* 1.24* 1.35* 1.26*  CALCIUM 8.6*  --   8.6* 8.1*   GFR: Estimated Creatinine Clearance: 32.9 mL/min (A) (by C-G formula based on SCr of 1.26 mg/dL (H)). Liver Function Tests: No results for input(s): AST, ALT, ALKPHOS, BILITOT, PROT, ALBUMIN in the last 168 hours. No results for input(s): LIPASE, AMYLASE in the last 168 hours. No results for input(s): AMMONIA in the last 168 hours. Coagulation Profile: No results for input(s): INR, PROTIME in the last 168 hours. Cardiac Enzymes: No results for input(s): CKTOTAL, CKMB, CKMBINDEX, TROPONINI in the last 168 hours. BNP (last 3 results) No results for input(s): PROBNP in the last 8760 hours. HbA1C: No results for input(s): HGBA1C in the last 72 hours. CBG: No results for input(s): GLUCAP in the last 168 hours. Lipid Profile: No results for input(s): CHOL, HDL, LDLCALC, TRIG, CHOLHDL, LDLDIRECT in the last 72 hours. Thyroid Function Tests: No results for input(s): TSH, T4TOTAL, FREET4, T3FREE, THYROIDAB in the last 72 hours. Anemia Panel: No results for input(s): VITAMINB12, FOLATE,  FERRITIN, TIBC, IRON, RETICCTPCT in the last 72 hours. Sepsis Labs: Recent Labs  Lab 01/25/19 1752 01/26/19 0642 01/27/19 0612  PROCALCITON <0.10 <0.10 <0.10    Recent Results (from the past 240 hour(s))  Respiratory Panel by RT PCR (Flu A&B, Covid) - Nasopharyngeal Swab     Status: None   Collection Time: 01/25/19 10:39 AM   Specimen: Nasopharyngeal Swab  Result Value Ref Range Status   SARS Coronavirus 2 by RT PCR NEGATIVE NEGATIVE Final    Comment: (NOTE) SARS-CoV-2 target nucleic acids are NOT DETECTED. The SARS-CoV-2 RNA is generally detectable in upper respiratoy specimens during the acute phase of infection. The lowest concentration of SARS-CoV-2 viral copies this assay can detect is 131 copies/mL. A negative result does not preclude SARS-Cov-2 infection and should not be used as the sole basis for treatment or other patient management decisions. A negative result may occur with    improper specimen collection/handling, submission of specimen other than nasopharyngeal swab, presence of viral mutation(s) within the areas targeted by this assay, and inadequate number of viral copies (<131 copies/mL). A negative result must be combined with clinical observations, patient history, and epidemiological information. The expected result is Negative. Fact Sheet for Patients:  https://www.moore.com/ Fact Sheet for Healthcare Providers:  https://www.young.biz/ This test is not yet ap proved or cleared by the [place] FDA and  has been authorized for detection and/or diagnosis of SARS-CoV-2 by FDA under an Emergency Use Authorization (EUA). This EUA will remain  in effect (meaning this test can be used) for the duration of the COVID-19 declaration under Section 564(b)(1) of the Act, 21 U.S.C. section 360bbb-3(b)(1), unless the authorization is terminated or revoked sooner.    Influenza A by PCR NEGATIVE NEGATIVE Final   Influenza B by PCR NEGATIVE NEGATIVE Final    Comment: (NOTE) The Xpert Xpress SARS-CoV-2/FLU/RSV assay is intended as an aid in  the diagnosis of influenza from Nasopharyngeal swab specimens and  should not be used as a sole basis for treatment. Nasal washings and  aspirates are unacceptable for Xpert Xpress SARS-CoV-2/FLU/RSV  testing. Fact Sheet for Patients: https://www.moore.com/ Fact Sheet for Healthcare Providers: https://www.young.biz/ This test is not yet approved or cleared by the [place] FDA and  has been authorized for detection and/or diagnosis of SARS-CoV-2 by  FDA under an Emergency Use Authorization (EUA). This EUA will remain  in effect (meaning this test can be used) for the duration of the  Covid-19 declaration under Section 564(b)(1) of the Act, 21  U.S.C. section 360bbb-3(b)(1), unless the authorization is  terminated or revoked. Performed at  [facility], [address], [place], [place] [address]   Respiratory Panel by PCR     Status: None   Collection Time: 01/27/19  4:11 AM   Specimen: Nasopharyngeal Swab; Respiratory  Result Value Ref Range Status   Adenovirus NOT DETECTED NOT DETECTED Final   Coronavirus 229E NOT DETECTED NOT DETECTED Final    Comment: (NOTE) The Coronavirus on the Respiratory Panel, DOES NOT test for the novel  Coronavirus (2019 nCoV)    Coronavirus HKU1 NOT DETECTED NOT DETECTED Final   Coronavirus NL63 NOT DETECTED NOT DETECTED Final   Coronavirus OC43 NOT DETECTED NOT DETECTED Final   Metapneumovirus NOT DETECTED NOT DETECTED Final   Rhinovirus / Enterovirus NOT DETECTED NOT DETECTED Final   Influenza A NOT DETECTED NOT DETECTED Final   Influenza B NOT DETECTED NOT DETECTED Final   Parainfluenza Virus 1 NOT DETECTED NOT DETECTED  Final   Parainfluenza Virus 2 NOT DETECTED NOT DETECTED Final   Parainfluenza Virus 3 NOT DETECTED NOT DETECTED Final   Parainfluenza Virus 4 NOT DETECTED NOT DETECTED Final   Respiratory Syncytial Virus NOT DETECTED NOT DETECTED Final   Bordetella pertussis NOT DETECTED NOT DETECTED Final   Chlamydophila pneumoniae NOT DETECTED NOT DETECTED Final   Mycoplasma pneumoniae NOT DETECTED NOT DETECTED Final    Comment: Performed at [facility], [address], [place], [place] [address]         Radiology Studies: DG Chest 1 View  Result Date: 01/27/2019 CLINICAL DATA:  Shortness of breath EXAM: CHEST  1 VIEW COMPARISON:  Two days ago FINDINGS: Cardiomegaly. Layering pleural effusions and congested appearance of vessels. No pneumothorax. No air bronchogram although airspace disease at the lung bases could be obscured. IMPRESSION: CHF pattern including pleural effusions. No convincing progression from 2 days ago. Electronically Signed   By: Marnee Spring M.D.   On: 01/27/2019 08:56   ECHOCARDIOGRAM COMPLETE  Result Date: 01/25/2019    ECHOCARDIOGRAM REPORT   Patient Name:   BRIYAH WHEELWRIGHT Date of Exam: 01/25/2019 Medical Rec #:  [id number]       Height:       64.0 in Accession #:    [id number]      Weight:       159.4 lb Date of Birth:  1928-03-24      BSA:          1.78 m Patient Age:    89 years        BP:           150/84 mmHg Patient Gender: F               HR:           109 bpm. Exam Location:  Inpatient Procedure: 2D Echo, Cardiac Doppler and Color Doppler Indications:     I48.0 Paroxysmal atrial fibrillation  History:         Patient has no prior history of Echocardiogram examinations.                  Risk Factors:Hypertension and Diabetes.  Sonographer:     Elmarie Shiley Dance Referring Phys:  [id number] Clydia Llano Diagnosing Phys: Marca Ancona MD IMPRESSIONS  1. Left ventricular ejection fraction, by visual estimation, is 60 to 65%. The left ventricle has normal function. There is no left ventricular hypertrophy.  2. The left ventricle has no regional wall motion abnormalities.  3. Left ventricular diastolic parameters are indeterminate.  4. Global right ventricle has mildly reduced systolic function.The right ventricular size is normal. No increase in right ventricular wall thickness.  5. Left atrial size was mild-moderately dilated.  6. Right atrial size was mildly dilated.  7. Moderate mitral annular calcification.  8. The mitral valve is normal in structure. Mild to moderate mitral valve regurgitation. No evidence of mitral stenosis.  9. The tricuspid valve is normal in structure. Tricuspid valve regurgitation is mild. 10. The aortic valve is tricuspid. Aortic valve regurgitation is trivial. Mild aortic valve sclerosis without stenosis. 11. The tricuspid regurgitant velocity is 2.45 m/s, and with an assumed right atrial pressure of 15 mmHg, the estimated right ventricular systolic pressure is mildly elevated at 38.9 mmHg. 12. The inferior vena cava is dilated in size with <50% respiratory variability, suggesting right atrial pressure  of 15 mmHg. 13. Trivial pericardial effusion is present. 14. Left-sided pleural effusion is present. 15. The  patient was in atrial fibrillation. FINDINGS  Left Ventricle: Left ventricular ejection fraction, by visual estimation, is 60 to 65%. The left ventricle has normal function. The left ventricle has no regional wall motion abnormalities. The left ventricular internal cavity size was the left ventricle is normal in size. There is no left ventricular hypertrophy. Left ventricular diastolic parameters are indeterminate. Right Ventricle: The right ventricular size is normal. No increase in right ventricular wall thickness. Global RV systolic function is has mildly reduced systolic function. The tricuspid regurgitant velocity is 2.45 m/s, and with an assumed right atrial pressure of 15 mmHg, the estimated right ventricular systolic pressure is mildly elevated at 38.9 mmHg. Left Atrium: Left atrial size was mild-moderately dilated. Right Atrium: Right atrial size was mildly dilated Pericardium: Trivial pericardial effusion is present. Left-sided pleural effusion is present. Mitral Valve: The mitral valve is normal in structure. There is mild calcification of the mitral valve leaflet(s). Moderate mitral annular calcification. No evidence of mitral valve stenosis by observation. Mild to moderate mitral valve regurgitation. Tricuspid Valve: The tricuspid valve is normal in structure. Tricuspid valve regurgitation is mild. Aortic Valve: The aortic valve is tricuspid. Aortic valve regurgitation is trivial. Mild aortic valve sclerosis is present, with no evidence of aortic valve stenosis. Pulmonic Valve: The pulmonic valve was normal in structure. Pulmonic valve regurgitation is not visualized. Aorta: The aortic root is normal in size and structure. Venous: The inferior vena cava is dilated in size with less than 50% respiratory variability, suggesting right atrial pressure of 15 mmHg. IAS/Shunts: No atrial level shunt  detected by color flow Doppler.  LEFT VENTRICLE PLAX 2D LVIDd:         4.30 cm LVIDs:         2.60 cm LV PW:         1.00 cm LV IVS:        1.10 cm LVOT diam:     1.90 cm LV SV:         58 ml LV SV Index:   32.03 LVOT Area:     2.84 cm  RIGHT VENTRICLE            IVC RV Basal diam:  3.10 cm    IVC diam: 2.70 cm RV Mid diam:    2.20 cm RV S prime:     9.57 cm/s TAPSE (M-mode): 1.5 cm LEFT ATRIUM             Index       RIGHT ATRIUM           Index LA diam:        4.60 cm 2.59 cm/m  RA Area:     23.60 cm LA Vol (A2C):   75.7 ml 42.61 ml/m RA Volume:   81.30 ml  45.76 ml/m LA Vol (A4C):   35.1 ml 19.76 ml/m LA Biplane Vol: 53.7 ml 30.23 ml/m  AORTIC VALVE LVOT Vmax:   95.15 cm/s LVOT Vmean:  62.650 cm/s LVOT VTI:    0.152 m  AORTA Ao Root diam: 3.30 cm Ao Asc diam:  3.20 cm MITRAL VALVE                        TRICUSPID VALVE MV Area (PHT): 9.14 cm             TR Peak grad:   23.9 mmHg MV PHT:        24.07 msec  TR Vmax:        278.00 cm/s MV Decel Time: 83 msec MV E velocity: 135.00 cm/s 103 cm/s SHUNTS                                     Systemic VTI:  0.15 m                                     Systemic Diam: 1.90 cm  Loralie Champagne MD Electronically signed by Loralie Champagne MD Signature Date/Time: 01/25/2019/5:00:55 PM    Final (Updated)         Scheduled Meds:  apixaban  2.5 mg Oral BID   azithromycin  500 mg Oral Daily   budesonide (PULMICORT) nebulizer solution  0.25 mg Nebulization BID   cholecalciferol  1,000 Units Oral Daily   insulin aspart  10 Units Intravenous Once   And   dextrose  1 ampule Intravenous Once   feeding supplement (ENSURE ENLIVE)  237 mL Oral BID BM   furosemide       furosemide  40 mg Intravenous Daily   hydrALAZINE  50 mg Oral TID   levalbuterol  0.63 mg Nebulization Q6H   lisinopril  40 mg Oral Daily   Melatonin  2 mg Oral QHS   methylPREDNISolone (SOLU-MEDROL) injection  40 mg Intravenous Q24H   metoprolol tartrate  12.5 mg Oral BID    mirabegron ER  25 mg Oral Daily   pantoprazole  40 mg Oral BID   sodium chloride  1 g Oral TID   vitamin C  1,000 mg Oral Daily   Continuous Infusions:  sodium chloride Stopped (01/25/19 2100)   cefTRIAXone (ROCEPHIN)  IV 1 g (01/26/19 2125)     LOS: 2 days    Time spent: 35 mins.More than 50% of that time was spent in counseling and/or coordination of care.      Shelly Coss, MD [facility] Pager [phone]  If 7PM-7AM, please contact night-coverage [email or web address] Password [email or web address] 01/27/2019, 1:01 PM

## 2019-01-27 NOTE — Care Management Important Message (Signed)
Important Message  Patient Details IM Letter given to Roque Lias SW Case Manager to present to the Patient Name: MAHIRA PETRAS MRN: [id number] Date of Birth: 15-Oct-1928   Medicare Important Message Given:  Yes     Jaleyah, [place] 01/27/2019, 12:05 PM

## 2019-01-28 DIAGNOSIS — R52 Pain, unspecified: Secondary | ICD-10-CM

## 2019-01-28 DIAGNOSIS — I509 Heart failure, unspecified: Secondary | ICD-10-CM

## 2019-01-28 LAB — BASIC METABOLIC PANEL
Anion gap: 13 (ref 5–15)
BUN: 39 mg/dL — ABNORMAL HIGH (ref 8–23)
CO2: 31 mmol/L (ref 22–32)
Calcium: 8.1 mg/dL — ABNORMAL LOW (ref 8.9–10.3)
Chloride: 96 mmol/L — ABNORMAL LOW (ref 98–111)
Creatinine, Ser: 1.29 mg/dL — ABNORMAL HIGH (ref 0.44–1.00)
GFR calc Af Amer: 43 mL/min — ABNORMAL LOW (ref >60)
GFR calc non Af Amer: 37 mL/min — ABNORMAL LOW (ref >60)
Glucose, Bld: 138 mg/dL — ABNORMAL HIGH (ref 70–99)
Potassium: 4.4 mmol/L (ref 3.5–5.1)
Sodium: 140 mmol/L (ref 135–145)

## 2019-01-28 LAB — CBC WITH DIFFERENTIAL/PLATELET
Abs Immature Granulocytes: 0.03 10*3/uL (ref 0.00–0.07)
Basophils Absolute: 0 10*3/uL (ref 0.0–0.1)
Basophils Relative: 0 %
Eosinophils Absolute: 0.1 10*3/uL (ref 0.0–0.5)
Eosinophils Relative: 1 %
HCT: 35.9 % — ABNORMAL LOW (ref 36.0–46.0)
Hemoglobin: 11.4 g/dL — ABNORMAL LOW (ref 12.0–15.0)
Immature Granulocytes: 0 %
Lymphocytes Relative: 17 %
Lymphs Abs: 1.6 10*3/uL (ref 0.7–4.0)
MCH: 29.5 pg (ref 26.0–34.0)
MCHC: 31.8 g/dL (ref 30.0–36.0)
MCV: 92.8 fL (ref 80.0–100.0)
Monocytes Absolute: 1.1 10*3/uL — ABNORMAL HIGH (ref 0.1–1.0)
Monocytes Relative: 11 %
Neutro Abs: 6.6 10*3/uL (ref 1.7–7.7)
Neutrophils Relative %: 71 %
Platelets: 334 10*3/uL (ref 150–400)
RBC: 3.87 MIL/uL (ref 3.87–5.11)
RDW: 14.2 % (ref 11.5–15.5)
WBC: 9.3 10*3/uL (ref 4.0–10.5)
nRBC: 0 % (ref 0.0–0.2)

## 2019-01-28 MED ORDER — LEVALBUTEROL HCL 0.63 MG/3ML IN NEBU
0.6300 mg | INHALATION_SOLUTION | RESPIRATORY_TRACT | Status: DC | PRN
  Administered 2019-01-28: 10:00:00 0.63 mg via RESPIRATORY_TRACT
  Filled 2019-01-28: qty 3

## 2019-01-28 MED ORDER — METOPROLOL TARTRATE 5 MG/5ML IV SOLN
2.5000 mg | Freq: Two times a day (BID) | INTRAVENOUS | Status: DC
  Administered 2019-01-28 – 2019-01-30 (×5): 2.5 mg via INTRAVENOUS
  Filled 2019-01-28 (×5): qty 5

## 2019-01-28 NOTE — Progress Notes (Signed)
PT Cancellation Note  Patient Details Name: Cheryl Graves MRN: [id number] DOB: 1928-03-17   Cancelled Treatment:     pt has had a significant medical decline with plans for Residential Hospice.     Rica Koyanagi  PTA Acute  Rehabilitation Services Pager      [phone] Office      [phone]

## 2019-01-28 NOTE — Progress Notes (Signed)
Palliative Medicine RN Note: Rec'd a call from PMT MD Loistine Chance requesting I initiate referral to [facility] residential/inpatient hospice. I called the referral in to [place] in their office and faxed clinicals.  Marjie Skiff Dainel Arcidiacono, RN, BSN, [facility] Palliative Medicine Team 01/28/2019 12:04 PM Office [phone]

## 2019-01-28 NOTE — Progress Notes (Addendum)
PROGRESS NOTE    Cheryl Graves  ZOX:[id number] DOB: 02/08/1929 DOA: 01/25/2019 PCP: [name]   Brief Narrative:  Patient is a 85 year old female who is from [facility] in a [place] with medical history of COPD, proximal A. Fib, hypertension, diabetes type 2 who presented with shortness of breath.  As per the report, she was having orthopnea, swelling of lower extremities.  She follows with [name] as an outpatient for her COPD.  She is on inhalers at home and on 2 L of oxygen via nasal cannula.  Patient was also noted to be tachycardic at that nursing facility.  On emergency department ,chest x-ray showed possible congestive heart failure with bilateral pleural effusions.  She was started on IV Lasix. Also started on broad start antibiotics for possible pneumonia.  She has developed right-sided hemiparesis and facial droop , code stroke was called neurology consulted, CT  Head showed hyperdense left MCA compatible with acute embolus. CT angio of head and neck showed left M2 proximal branch occlusion. Dr Otelia Limes neurologist on-call suggest  plan for transfer to [facility] for urgent IR intervention.  Family does not want any intervention.  Palliative care consulted.  Family meeting arranged and completed. Residential hospice options discussed.  Family prefers hospice of [place].  Social worker is working on placement.  Assessment & Plan:   Active Problems:   Acute CHF (congestive heart failure) (HCC)  Acute left MCA stroke: She has developed right-sided hemiparesis and facial droop , code stroke was called neurology consulted, CT  Head showed hyperdense left MCA compatible with acute embolus. CT angio of head and neck showed left M2 proximal branch occlusion. Dr Otelia Limes neurologist on-call suggest  plan for transfer to [facility] for urgent IR intervention.  Family does not want any intervention.  Palliative care consulted.  Prognosis not more than 2  weeks due to extensive stroke minimal to no oral intake multiple comorbid conditions. Family agreement on Hospice.  Acute on chronic hypoxic respiratory failure: Secondary to COPD exacerbation but could be also due to pneumonia or new onset diastolic CHF.  Currently she is on 2 L of oxygen per minute which is her baseline.    Chest x-ray again  showed some pleural effusions. She is at baseline respiratory status.  Possible diastolic CHF: Echocardiogram showed normal left ventricular ejection fraction, mildly reduced right ventricular systolic function.  She was complaining of orthopnea and bilateral lower extremity edema. continue IV Lasix for now  . Continue to monitor daily weight, input/output. On reviewing her chest x-ray, she does not have significant pleural effusion for thoracentesis.  Continue IV diuretic treatment for now. Change to oral when appropriate.  COPD exacerbation: Follows with [name], pulmonology.  On 2 L of oxygen at baseline.  Continue bronchodilators as needed.  She is on inhalers at home.Started on steroids.  Possible community-acquired pneumonia: Chest x-ray on presentation could not  out pneumonia.  She is afebrile, no leukocytosis.  Continue current antibiotics for now.  CKD stage IIIa: Creatinine ranges from 1.2-1.5.  Currently kidney function at baseline.  Paroxysmal A. fib: Rate controlled.  On Eliquis.  On metoprolol for rate control.  Hypertension: Currently  stable.  Continue lisinopril, hydralazine, metoprolol  Diabetes type 2: Last hemoglobin C 5.7.  Not on oral agents at home.  Continue sliding-scale insulin   Hyponatremia: Currently stable  GERD: Continue PPI  Advanced age/debility/deconditioning: Resides in assisted living facility.  PT/OT evaluation done and recommended SNF.  Nutrition Problem:  Increased nutrient needs Etiology: chronic illness   DVT prophylaxis:Eilquis Code Status: Full Family Communication:Called niece on phone  01/26/19 Disposition Plan: Home with home hospice.   Consultants:   Neurology  Procedures: Antimicrobials:  Anti-infectives (From admission, onward)   Start     Dose/Rate Route Frequency Ordered Stop   01/26/19 1800  azithromycin (ZITHROMAX) tablet 500 mg     500 mg Oral Daily 01/25/19 1728 01/30/19 0959   01/25/19 2000  cefTRIAXone (ROCEPHIN) 1 g in sodium chloride 0.9 % 100 mL IVPB     1 g 200 mL/hr over 30 Minutes Intravenous Every 24 hours 01/25/19 1728     01/25/19 1800  azithromycin (ZITHROMAX) 500 mg in sodium chloride 0.9 % 250 mL IVPB     500 mg 250 mL/hr over 60 Minutes Intravenous Every 24 hours 01/25/19 1728 01/25/19 2100     Subjective: Patient was seen and examined at bedside, she has right-sided weakness.  Lying comfortably on the bed.  Objective: Vitals:   01/28/19 0917 01/28/19 1132 01/28/19 1141 01/28/19 1419  BP: (!) 196/76 (!) 168/156 (!) 150/90   Pulse: (!) 102 70 (!) 53   Resp: (!) 24 (!) 24 (!) 24   Temp: 97.9 F (36.6 C) (!) 97.3 F (36.3 C) 97.6 F (36.4 C)   TempSrc: Oral Oral Oral   SpO2: 97% 97% 94% 95%  Weight:      Height:        Intake/Output Summary (Last 24 hours) at 01/28/2019 1519 Last data filed at 01/28/2019 1353 Gross per 24 hour  Intake 233 ml  Output 3500 ml  Net -3267 ml   Filed Weights   01/26/19 0541 01/27/19 0500 01/28/19 0500  Weight: 87.1 kg 86.8 kg 90 kg    Examination:  General exam: Appears calm and comfortable  Respiratory system: Clear to auscultation. Respiratory effort normal. Cardiovascular system: S1 & S2 heard, RRR. No JVD, murmurs, rubs, gallops or clicks. No pedal edema. Gastrointestinal system: Abdomen is nondistended, soft and nontender. No organomegaly or masses felt. Normal bowel sounds heard. Central nervous system: Alert and oriented.  Right-sided weakness.   Extremities:  no edema, no clubbing no cyanosis Skin: No rashes, lesions or ulcers Psychiatry:  Mood & affect appropriate.     Data  Reviewed: I have personally reviewed following labs and imaging studies  CBC: Recent Labs  Lab 01/25/19 1039 01/25/19 1752 01/26/19 0642 01/28/19 0610  WBC 9.0 5.1 8.7 9.3  NEUTROABS 7.4  --  6.8 6.6  HGB 11.6* 11.4* 10.8* 11.4*  HCT 37.2 36.8 35.1* 35.9*  MCV 94.2 94.8 94.4 92.8  PLT 329 310 307 334   Basic Metabolic Panel: Recent Labs  Lab 01/25/19 1039 01/25/19 1752 01/26/19 0642 01/27/19 0612 01/28/19 0610  NA 137  --  138 138 140  K 5.4*  --  4.0 3.3* 4.4  CL 103  --  97* 95* 96*  CO2 25  --  29 31 31   GLUCOSE 194*  --  144* 135* 138*  BUN 21  --  27* 42* 39*  CREATININE 1.16* 1.24* 1.35* 1.26* 1.29*  CALCIUM 8.6*  --  8.6* 8.1* 8.1*   GFR: Estimated Creatinine Clearance: 32.7 mL/min (A) (by C-G formula based on SCr of 1.29 mg/dL (H)). Liver Function Tests: No results for input(s): AST, ALT, ALKPHOS, BILITOT, PROT, ALBUMIN in the last 168 hours. No results for input(s): LIPASE, AMYLASE in the last 168 hours. No results for input(s): AMMONIA in the last 168 hours.  Coagulation Profile: No results for input(s): INR, PROTIME in the last 168 hours. Cardiac Enzymes: No results for input(s): CKTOTAL, CKMB, CKMBINDEX, TROPONINI in the last 168 hours. BNP (last 3 results) No results for input(s): PROBNP in the last 8760 hours. HbA1C: No results for input(s): HGBA1C in the last 72 hours. CBG: Recent Labs  Lab 01/27/19 1338  GLUCAP 318*   Lipid Profile: No results for input(s): CHOL, HDL, LDLCALC, TRIG, CHOLHDL, LDLDIRECT in the last 72 hours. Thyroid Function Tests: No results for input(s): TSH, T4TOTAL, FREET4, T3FREE, THYROIDAB in the last 72 hours. Anemia Panel: No results for input(s): VITAMINB12, FOLATE, FERRITIN, TIBC, IRON, RETICCTPCT in the last 72 hours. Sepsis Labs: Recent Labs  Lab 01/25/19 1752 01/26/19 0642 01/27/19 0612  PROCALCITON <0.10 <0.10 <0.10    Recent Results (from the past 240 hour(s))  Respiratory Panel by RT PCR (Flu A&B,  Covid) - Nasopharyngeal Swab     Status: None   Collection Time: 01/25/19 10:39 AM   Specimen: Nasopharyngeal Swab  Result Value Ref Range Status   SARS Coronavirus 2 by RT PCR NEGATIVE NEGATIVE Final    Comment: (NOTE) SARS-CoV-2 target nucleic acids are NOT DETECTED. The SARS-CoV-2 RNA is generally detectable in upper respiratoy specimens during the acute phase of infection. The lowest concentration of SARS-CoV-2 viral copies this assay can detect is 131 copies/mL. A negative result does not preclude SARS-Cov-2 infection and should not be used as the sole basis for treatment or other patient management decisions. A negative result may occur with  improper specimen collection/handling, submission of specimen other than nasopharyngeal swab, presence of viral mutation(s) within the areas targeted by this assay, and inadequate number of viral copies (<131 copies/mL). A negative result must be combined with clinical observations, patient history, and epidemiological information. The expected result is Negative. Fact Sheet for Patients:  https://www.moore.com/ Fact Sheet for Healthcare Providers:  https://www.young.biz/ This test is not yet ap proved or cleared by the [place] FDA and  has been authorized for detection and/or diagnosis of SARS-CoV-2 by FDA under an Emergency Use Authorization (EUA). This EUA will remain  in effect (meaning this test can be used) for the duration of the COVID-19 declaration under Section 564(b)(1) of the Act, 21 U.S.C. section 360bbb-3(b)(1), unless the authorization is terminated or revoked sooner.    Influenza A by PCR NEGATIVE NEGATIVE Final   Influenza B by PCR NEGATIVE NEGATIVE Final    Comment: (NOTE) The Xpert Xpress SARS-CoV-2/FLU/RSV assay is intended as an aid in  the diagnosis of influenza from Nasopharyngeal swab specimens and  should not be used as a sole basis for treatment. Nasal washings and    aspirates are unacceptable for Xpert Xpress SARS-CoV-2/FLU/RSV  testing. Fact Sheet for Patients: https://www.moore.com/ Fact Sheet for Healthcare Providers: https://www.young.biz/ This test is not yet approved or cleared by the [place] FDA and  has been authorized for detection and/or diagnosis of SARS-CoV-2 by  FDA under an Emergency Use Authorization (EUA). This EUA will remain  in effect (meaning this test can be used) for the duration of the  Covid-19 declaration under Section 564(b)(1) of the Act, 21  U.S.C. section 360bbb-3(b)(1), unless the authorization is  terminated or revoked. Performed at [facility], [address] [place], [place] [address]   Respiratory Panel by PCR     Status: None   Collection Time: 01/27/19  4:11 AM   Specimen: Nasopharyngeal Swab; Respiratory  Result Value Ref Range Status   Adenovirus NOT  DETECTED NOT DETECTED Final   Coronavirus 229E NOT DETECTED NOT DETECTED Final    Comment: (NOTE) The Coronavirus on the Respiratory Panel, DOES NOT test for the novel  Coronavirus (2019 nCoV)    Coronavirus HKU1 NOT DETECTED NOT DETECTED Final   Coronavirus NL63 NOT DETECTED NOT DETECTED Final   Coronavirus OC43 NOT DETECTED NOT DETECTED Final   Metapneumovirus NOT DETECTED NOT DETECTED Final   Rhinovirus / Enterovirus NOT DETECTED NOT DETECTED Final   Influenza A NOT DETECTED NOT DETECTED Final   Influenza B NOT DETECTED NOT DETECTED Final   Parainfluenza Virus 1 NOT DETECTED NOT DETECTED Final   Parainfluenza Virus 2 NOT DETECTED NOT DETECTED Final   Parainfluenza Virus 3 NOT DETECTED NOT DETECTED Final   Parainfluenza Virus 4 NOT DETECTED NOT DETECTED Final   Respiratory Syncytial Virus NOT DETECTED NOT DETECTED Final   Bordetella pertussis NOT DETECTED NOT DETECTED Final   Chlamydophila pneumoniae NOT DETECTED NOT DETECTED Final   Mycoplasma pneumoniae NOT DETECTED NOT DETECTED Final     Comment: Performed at [facility], [address] [place], [place] [address]         Radiology Studies: CT ANGIO HEAD W OR WO CONTRAST  Result Date: 01/27/2019 CLINICAL DATA:  Stroke, follow-up. Additional history provided: Acute onset right-sided facial droop, right upper and lower extremity weakness, unable to communicate, last known normal 12:30 p.m. EXAM: CT ANGIOGRAPHY HEAD AND NECK TECHNIQUE: Multidetector CT imaging of the head and neck was performed using the standard protocol during bolus administration of intravenous contrast. Multiplanar CT image reconstructions and MIPs were obtained to evaluate the vascular anatomy. Carotid stenosis measurements (when applicable) are obtained utilizing NASCET criteria, using the distal internal carotid diameter as the denominator. CONTRAST:  75mL OMNIPAQUE IOHEXOL 350 MG/ML SOLN COMPARISON:  None. FINDINGS: CTA NECK FINDINGS Aortic arch: Standard aortic branching. Soft and calcified plaque within the visualized aortic arch and proximal major branch vessels of the neck. Right carotid system: Proximal and mid common carotid artery patent without significant stenosis. Prominent calcified plaque at the carotid bifurcation extending into the proximal ICA. Estimated stenosis of up to 70% as compared to the more distal ICA, although this may be overestimated due to blooming artifact from prominent calcified plaque. Distal to this, the ICA is patent within the neck without significant stenosis. Left carotid system: Soft and calcified plaque within the proximal CCA with less than 50% stenosis. Prominent soft and calcified plaque within the distal CCA extending into the left ICA origin. Resultant high-grade stenosis at the left ICA origin. Exact degree of stenosis is difficult to quantify due to blooming from prominent calcified plaque. Distal to this, the ICA is markedly diminutive within the neck. Minimal ICA enhancement throughout the siphon region and  within the supraclinoid segment. There is little flow related enhancement within the cavernous through supraclinoid internal carotid artery. Vertebral arteries: The vertebral arteries are codominant. Mixed plaque results in sites of moderate/severe stenosis within the V1 right vertebral artery and severe stenosis within the V1 left vertebral artery. Distal to this, the bilateral vertebral arteries are patent within the neck without significant stenosis (50% or greater). Skeleton: No acute bony abnormality. Cervical spondylosis with multilevel posterior disc osteophytes, uncovertebral and facet hypertrophy. Other neck: No neck mass or cervical lymphadenopathy. Upper chest: Partially imaged large bilateral pleural effusions. Edema also present within the bilateral lung apices. Review of the MIP images confirms the above findings CTA HEAD FINDINGS Anterior circulation: There is minimal enhancement within  the intracranial internal carotid artery. Paucity of enhancement is most notable within the paraclinoid through supraclinoid segments. The intracranial right internal carotid artery is patent with scattered calcified plaque, but no significant stenosis. There is more robust reconstitution of the M1 left middle cerebral artery, likely via the anterior communicating artery and left A1 segment. The M1 left middle cerebral artery is patent without significant stenosis. There is occlusion of a proximal M2 MCA branch vessel shortly beyond its origin (series 10, image 96). The left anterior cerebral artery is patent. High-grade focal stenosis within the proximal A1 left ACA. The right middle and anterior cerebral arteries are patent without significant proximal stenosis. No intracranial aneurysm is identified. Posterior circulation: The intracranial vertebral arteries are patent without significant stenosis, as is the basilar artery. The posterior cerebral arteries are patent bilaterally without significant proximal stenosis.  Posterior communicating arteries are poorly delineated and may be hypoplastic or absent bilaterally. Venous sinuses: Within limitations of contrast timing, no convincing thrombus. Anatomic variants: As described Review of the MIP images confirms the above findings Findings of severe left cervical ICA stenosis with minimal enhancement of the intracranial left internal carotid artery, as well as proximal M2 left MCA occlusion called by telephone at the time of interpretation on 01/27/2019 at 2:56 pm to provider Dr. Burnadette Pop , Who verbally acknowledged these results. IMPRESSION: CTA neck: 1. Atherosclerotic disease within the visualized aortic arch and the branch vessels of the neck, as detailed and most notably as follows. 2. Prominent calcified plaque within the distal left CCA and proximal ICA with high-grade stenosis of the ICA origin. Distal to this, the ICA remains markedly diminutive within the neck. 3. Prominent calcified plaque within the distal right CCA and proximal ICA with estimated stenosis up to 70% stenosis as compared to the more distal ICA. However, this may be overestimated due to blooming artifact from prominent calcified plaque. Consider carotid artery duplex for further evaluation. 4. Mixed plaque within the V1 vertebral arteries bilaterally with sites of moderate/severe stenosis on the right and severe stenosis on the left. 5. Partially visualized pulmonary edema and large bilateral pleural effusions. CTA head: 1. Minimal enhancement within the intracranial left internal carotid artery with paucity of enhancement most notable in the cavernous through supraclinoid segments. More robust reconstitution of flow within the M1 left middle cerebral artery, likely via cross flow from the anterior communicating artery and A1 left ACA. 2. Occlusion of an M2 left middle cerebral artery branch vessel, shortly beyond its origin. 3. High-grade focal stenosis within the proximal A1 left anterior cerebral  artery. Electronically Signed   By: Jackey Loge DO   On: 01/27/2019 15:23   DG Chest 1 View  Result Date: 01/27/2019 CLINICAL DATA:  Shortness of breath EXAM: CHEST  1 VIEW COMPARISON:  Two days ago FINDINGS: Cardiomegaly. Layering pleural effusions and congested appearance of vessels. No pneumothorax. No air bronchogram although airspace disease at the lung bases could be obscured. IMPRESSION: CHF pattern including pleural effusions. No convincing progression from 2 days ago. Electronically Signed   By: Marnee Spring M.D.   On: 01/27/2019 08:56   CT ANGIO NECK W OR WO CONTRAST  Result Date: 01/27/2019 CLINICAL DATA:  Stroke, follow-up. Additional history provided: Acute onset right-sided facial droop, right upper and lower extremity weakness, unable to communicate, last known normal 12:30 p.m. EXAM: CT ANGIOGRAPHY HEAD AND NECK TECHNIQUE: Multidetector CT imaging of the head and neck was performed using the standard protocol during bolus administration of intravenous  contrast. Multiplanar CT image reconstructions and MIPs were obtained to evaluate the vascular anatomy. Carotid stenosis measurements (when applicable) are obtained utilizing NASCET criteria, using the distal internal carotid diameter as the denominator. CONTRAST:  75mL OMNIPAQUE IOHEXOL 350 MG/ML SOLN COMPARISON:  None. FINDINGS: CTA NECK FINDINGS Aortic arch: Standard aortic branching. Soft and calcified plaque within the visualized aortic arch and proximal major branch vessels of the neck. Right carotid system: Proximal and mid common carotid artery patent without significant stenosis. Prominent calcified plaque at the carotid bifurcation extending into the proximal ICA. Estimated stenosis of up to 70% as compared to the more distal ICA, although this may be overestimated due to blooming artifact from prominent calcified plaque. Distal to this, the ICA is patent within the neck without significant stenosis. Left carotid system: Soft and  calcified plaque within the proximal CCA with less than 50% stenosis. Prominent soft and calcified plaque within the distal CCA extending into the left ICA origin. Resultant high-grade stenosis at the left ICA origin. Exact degree of stenosis is difficult to quantify due to blooming from prominent calcified plaque. Distal to this, the ICA is markedly diminutive within the neck. Minimal ICA enhancement throughout the siphon region and within the supraclinoid segment. There is little flow related enhancement within the cavernous through supraclinoid internal carotid artery. Vertebral arteries: The vertebral arteries are codominant. Mixed plaque results in sites of moderate/severe stenosis within the V1 right vertebral artery and severe stenosis within the V1 left vertebral artery. Distal to this, the bilateral vertebral arteries are patent within the neck without significant stenosis (50% or greater). Skeleton: No acute bony abnormality. Cervical spondylosis with multilevel posterior disc osteophytes, uncovertebral and facet hypertrophy. Other neck: No neck mass or cervical lymphadenopathy. Upper chest: Partially imaged large bilateral pleural effusions. Edema also present within the bilateral lung apices. Review of the MIP images confirms the above findings CTA HEAD FINDINGS Anterior circulation: There is minimal enhancement within the intracranial internal carotid artery. Paucity of enhancement is most notable within the paraclinoid through supraclinoid segments. The intracranial right internal carotid artery is patent with scattered calcified plaque, but no significant stenosis. There is more robust reconstitution of the M1 left middle cerebral artery, likely via the anterior communicating artery and left A1 segment. The M1 left middle cerebral artery is patent without significant stenosis. There is occlusion of a proximal M2 MCA branch vessel shortly beyond its origin (series 10, image 96). The left anterior  cerebral artery is patent. High-grade focal stenosis within the proximal A1 left ACA. The right middle and anterior cerebral arteries are patent without significant proximal stenosis. No intracranial aneurysm is identified. Posterior circulation: The intracranial vertebral arteries are patent without significant stenosis, as is the basilar artery. The posterior cerebral arteries are patent bilaterally without significant proximal stenosis. Posterior communicating arteries are poorly delineated and may be hypoplastic or absent bilaterally. Venous sinuses: Within limitations of contrast timing, no convincing thrombus. Anatomic variants: As described Review of the MIP images confirms the above findings Findings of severe left cervical ICA stenosis with minimal enhancement of the intracranial left internal carotid artery, as well as proximal M2 left MCA occlusion called by telephone at the time of interpretation on 01/27/2019 at 2:56 pm to provider Dr. Burnadette PopAmrit Adhikari , Who verbally acknowledged these results. IMPRESSION: CTA neck: 1. Atherosclerotic disease within the visualized aortic arch and the branch vessels of the neck, as detailed and most notably as follows. 2. Prominent calcified plaque within the distal left CCA and proximal  ICA with high-grade stenosis of the ICA origin. Distal to this, the ICA remains markedly diminutive within the neck. 3. Prominent calcified plaque within the distal right CCA and proximal ICA with estimated stenosis up to 70% stenosis as compared to the more distal ICA. However, this may be overestimated due to blooming artifact from prominent calcified plaque. Consider carotid artery duplex for further evaluation. 4. Mixed plaque within the V1 vertebral arteries bilaterally with sites of moderate/severe stenosis on the right and severe stenosis on the left. 5. Partially visualized pulmonary edema and large bilateral pleural effusions. CTA head: 1. Minimal enhancement within the  intracranial left internal carotid artery with paucity of enhancement most notable in the cavernous through supraclinoid segments. More robust reconstitution of flow within the M1 left middle cerebral artery, likely via cross flow from the anterior communicating artery and A1 left ACA. 2. Occlusion of an M2 left middle cerebral artery branch vessel, shortly beyond its origin. 3. High-grade focal stenosis within the proximal A1 left anterior cerebral artery. Electronically Signed   By: Kellie Simmering DO   On: 01/27/2019 15:23   MR BRAIN WO CONTRAST  Result Date: 01/27/2019 CLINICAL DATA:  Stroke.  Right-sided weakness EXAM: MRI HEAD WITHOUT CONTRAST TECHNIQUE: Multiplanar, multiecho pulse sequences of the brain and surrounding structures were obtained without intravenous contrast. COMPARISON:  CT head earlier today FINDINGS: Brain: Acute infarct in the deep white matter on the left involving the external capsule and corona radiata. Small areas of acute infarct in the left parietal cortex over the convexity. Moderate atrophy without hydrocephalus. No intracranial hemorrhage or mass. Chronic microvascular ischemic changes in the white matter. Chronic ischemia in the pons. Vascular: Loss of flow void left internal carotid artery compatible with slow flow or occlusion. Otherwise normal flow voids. Skull and upper cervical spine: No focal skeletal abnormality. Sinuses/Orbits: Bilateral cataract surgery.  Paranasal sinuses clear Other: None IMPRESSION: Acute infarct in the deep white matter on the left. Small areas of acute infarct in the left parietal cortex over the convexity Loss of flow void left internal carotid artery compatible with slow flow or occlusion. Electronically Signed   By: Franchot Gallo M.D.   On: 01/27/2019 15:34   CT HEAD CODE STROKE WO CONTRAST  Result Date: 01/27/2019 CLINICAL DATA:  Code stroke. Altered mental status. Right sided weakness and facial droop. EXAM: CT HEAD WITHOUT CONTRAST  TECHNIQUE: Contiguous axial images were obtained from the base of the skull through the vertex without intravenous contrast. COMPARISON:  None. FINDINGS: Brain: Moderate atrophy. Hypodensity throughout the cerebral white matter bilaterally most consistent with chronic microvascular ischemia. No acute cortical infarct. Negative for hemorrhage or mass. Vascular: Hyperdense left MCA in the sylvian fissure involving distal M1 and M2 segment. This is compatible with acute thrombus. Skull: Negative Sinuses/Orbits: Mild mucosal edema left frontal sinus. Bilateral cataract surgery Other: None ASPECTS ([place] Stroke Program Early CT Score) - Ganglionic level infarction (caudate, lentiform nuclei, internal capsule, insula, M1-M3 cortex): 7 - Supraganglionic infarction (M4-M6 cortex): 3 Total score (0-10 with 10 being normal): 10 IMPRESSION: 1. Hyperdense left MCA compatible with acute embolus in this patient with atrial fibrillation. No evidence of acute ischemic cortical infarct 2. Atrophy and chronic microvascular ischemia 3. ASPECTS is 10 4. These results were called by telephone at the time of interpretation on 01/27/2019 at 2:10 pm to provider [facility] , who verbally acknowledged these results. Electronically Signed   By: Franchot Gallo M.D.   On: 01/27/2019 14:12  Scheduled Meds: . aspirin EC  81 mg Oral Daily  . azithromycin  500 mg Oral Daily  . budesonide (PULMICORT) nebulizer solution  0.25 mg Nebulization BID  . cholecalciferol  1,000 Units Oral Daily  . insulin aspart  10 Units Intravenous Once   And  . dextrose  1 ampule Intravenous Once  . feeding supplement (ENSURE ENLIVE)  237 mL Oral BID BM  . furosemide  40 mg Intravenous Daily  . hydrALAZINE  50 mg Oral TID  . levalbuterol  0.63 mg Nebulization TID  . lisinopril  40 mg Oral Daily  . Melatonin  2 mg Oral QHS  . metoprolol tartrate  2.5 mg Intravenous Q12H  . mirabegron ER  25 mg Oral Daily  . pantoprazole  40 mg Oral BID    . predniSONE  40 mg Oral Q breakfast  . vitamin C  1,000 mg Oral Daily   Continuous Infusions: . sodium chloride Stopped (01/25/19 2100)  . cefTRIAXone (ROCEPHIN)  IV 1 g (01/27/19 1955)     LOS: 3 days    Time spent:     Cipriano Bunker, MD [facility]   If 7PM-7AM, please contact night-coverage

## 2019-01-28 NOTE — TOC Progression Note (Signed)
Transition of Care [facility]) - Progression Note    Patient Details  Name: Cheryl Graves MRN: [id number] Date of Birth: 11/14/1928  Transition of Care [facility]) CM/SW [place], [place] Phone Number: 01/28/2019, 2:40 PM  Clinical Narrative:   CSW consulted about need for hospice house referral.  Note by Marjie Skiff. [place] of Palliative Medicine Team seen and appreciated.  Patient has been referred to [facility]. TOC will continue to follow during the course of hospitalization.     Expected Discharge Plan: [place] Barriers to Discharge: No Barriers Identified  Expected Discharge Plan and Services Expected Discharge Plan: [place]   Discharge Planning Services: CM Consult   Living arrangements for the past 2 months: [place]                                       Social Determinants of Health (SDOH) Interventions    Readmission Risk Interventions No flowsheet data found.

## 2019-01-28 NOTE — Progress Notes (Signed)
  Speech Language Pathology Treatment: Dysphagia  Patient Details Name: Cheryl Graves MRN: [id number] DOB: 1928/11/07 Today's Date: 01/28/2019 Time: 3244-0102 SLP Time Calculation (min) (ACUTE ONLY): 24 min  Assessment / Plan / Recommendation Clinical Impression  Patient today fully awake in bed, much improved mentation and agreeable to consume po intake.  Her right facial asymmetry is not as prominent as last night and she is not dyspneic.  SLP provided pt with intake of solids, pudding, icecream and juice.  She is able to problem solve to use her left hand to feed herself (is right hand dominant) after first trial of each spoonable items.  Adequate mastication without oral pocketing noted.  Minimal labial spillage of icecream on right due to decreased labial closure.  Pt's swallow is timely - most notably with liquids - and strong without indication of significant dysphagia or airway compromise.  Cough x1 of approximately 10 boluses of pudding - likely due to viscous oral residuals spilling into pharynx/larynx.  NO further coughing apparent with all po observed.  She did appear to have minimal dyspnea after swallowing thin via straw - thus recommend to avoid its use for maximal comfort.  Pt uses caution with self feeding and takes small boluses without verbal cues needed fortunately especially given her cva impacting her ability to follow directions.   Recommend dys3/thin diet - allowing pt to self feed as able.  Avoid straws and medications with puree.  Left written handouts re: comfort feeding for family/pt.  This diet appears to provide pt with the most comfort as she clearly enjoyed foods she could masticate during the session.  Recommend modification as needed as pt's medical status changes. Thanks for allowing me to help care for this most kind pt.         HPI HPI: 85 yo female with h/o HTN, DM [facility]ing and difficulty breathing.  Pt COVID  negative, CXR showed pulmonary edema. Pt admits to h/o GERD for which she takes reflux medications religiously.  Pt with change in mentation today- right facial droop and decreased right are movement.  Code stroke called - Pt found to have Acute infarct in the deep white matter on the left. Small areas of acute infarct in the left parietal cortex over the convexity.  Swallow evaluation ordered.  Pt evaluation completed on 12/10 pm was very concerning for airway protection with po given pt's congestion, increased RR and complaint of dyspnea post-swallow of minimal moisture (x2 swabs) via toothette.  NPO recommended.  Today pt has been seen by palliative group with goal for comfort care.  Order received to re=eval for most appropriate comfort feeds. -      SLP Plan  All goals met       Recommendations  Diet recommendations: Dysphagia 3 (mechanical soft);Thin liquid Liquids provided via: Cup;No straw Medication Administration: Whole meds with puree Compensations: Small sips/bites;Slow rate(rest if short of breath, encourage pt to feed herself) Postural Changes and/or Swallow Maneuvers: Seated upright 90 degrees;Upright 30-60 min after meal                Follow up Recommendations: None SLP Visit Diagnosis: Dysphagia, oropharyngeal phase (R13.12) Plan: All goals met       GO              Kathleen Lime, MS [facility] SLP Acute Rehab Services Office [phone]   Macario Golds 01/28/2019, 2:17 PM

## 2019-01-28 NOTE — Progress Notes (Signed)
Daily Progress Note   Patient Name: Cheryl Graves       Date: 01/28/2019 DOB: 03/04/1928  Age: 85 y.o. MRN#: [id number] Attending Physician: Shawna Clamp, MD Primary Care Physician: [facility] Date: 01/25/2019  Reason for Consultation/Follow-up: Establishing goals of care  Subjective: Awake, slurred speech.  Not able to verbalize well.  Confused at times.  Length of Stay: 3  Current Medications: Scheduled Meds:  . aspirin EC  81 mg Oral Daily  . azithromycin  500 mg Oral Daily  . budesonide (PULMICORT) nebulizer solution  0.25 mg Nebulization BID  . cholecalciferol  1,000 Units Oral Daily  . insulin aspart  10 Units Intravenous Once   And  . dextrose  1 ampule Intravenous Once  . feeding supplement (ENSURE ENLIVE)  237 mL Oral BID BM  . furosemide  40 mg Intravenous Daily  . hydrALAZINE  50 mg Oral TID  . levalbuterol  0.63 mg Nebulization TID  . lisinopril  40 mg Oral Daily  . Melatonin  2 mg Oral QHS  . metoprolol tartrate  2.5 mg Intravenous Q12H  . mirabegron ER  25 mg Oral Daily  . pantoprazole  40 mg Oral BID  . predniSONE  40 mg Oral Q breakfast  . vitamin C  1,000 mg Oral Daily    Continuous Infusions: . sodium chloride Stopped (01/25/19 2100)  . cefTRIAXone (ROCEPHIN)  IV 1 g (01/27/19 1955)    PRN Meds: acetaminophen, dextromethorphan, dextromethorphan-guaiFENesin, HYDROmorphone (DILAUDID) injection, levalbuterol, ondansetron (ZOFRAN) IV, ondansetron  Physical Exam         Frail, weak Shallow breathing S1-S2 Generalized weakness Abdomen not distended No edema  Vital Signs: BP (!) 150/90 (BP Location: Right Arm) Comment: nurses notified  Pulse (!) 53 Comment: nurses notified   Temp 97.6 F (36.4 C) (Oral)    Resp (!) 24 Comment: nurses notified  Ht 5' 4.02" (1.626 m)   Wt 90 kg   SpO2 94%   BMI 34.04 kg/m  SpO2: SpO2: 94 % O2 Device: O2 Device: Nasal Cannula O2 Flow Rate: O2 Flow Rate (L/min): 2 L/min  Intake/output summary:   Intake/Output Summary (Last 24 hours) at 01/28/2019 1150 Last data filed at 01/28/2019 1129 Gross per 24 hour  Intake 120 ml  Output 3800 ml  Net -3680 ml  LBM: Last BM Date: 01/25/19 Baseline Weight: Weight: 72.3 kg Most recent weight: Weight: 90 kg       Palliative Assessment/Data:      Patient Active Problem List   Diagnosis Date Noted  . Acute CHF (congestive heart failure) ([place]) 01/25/2019  . Hyponatremia 11/14/2018  . Type 2 diabetes mellitus ([place]) 11/14/2018  . Physical deconditioning 11/14/2018  . Hypertensive urgency 11/14/2018  . UTI (urinary tract infection) 11/13/2018    Palliative Care Assessment & Plan   Patient Profile: 85 year old lady with COPD, atrial fibrillation, hypertension, diabetes.  Admitted with acute on chronic hypoxic respiratory failure, COPD exacerbation acute heart failure exacerbation.  Hospital course complicated by patient developing acute stroke here in the hospital found to have severe left MCA syndrome cerebrovascular accident deemed embolic in nature, MRI also showing atrophy.  Palliative medicine team following for goals of care discussions.  Assessment: Frail, weak Generalized weakness Slurred speech Difficulty swallowing Confused.  Family Meeting/Recommendations/Plan:  Family meeting: I met with the patient's niece [name], other niece, granddaughter of one of the nieces who came in from [place].  Brief life review performed.  Patient is described as a good Christian lady who has always lived independently.  She has only been in the facility for the past 2 months.  She has not had a good experience over there for various reasons.  She has had gradual progressive decline.  We discussed in detail about  patient's current hospitalization.  Sequelae of stroke, consequences of having dense stroke and resultant deficits discussed in detail.  Concepts specific to artificial nutrition and hydration versus comfort feeds was explored in detail.  Brain imaging was reviewed in detail.  Goals wishes and values important to patient and family as a unit attempted to be explored.  All of their questions addressed to the best of my ability.  Offered active listening, compassionate presence and supportive care.  Plan: DNR DNI  Comfort feeds, resume safest possible PO options to honor the patient's wishes.   No artificial feeding tubes.   Residential hospice: Options and choices discussed.  Family prefers [facility].  Request social work consult to facilitate  Continue current mode of care for now.   Prognosis not more than 2 weeks due to extensive stroke, minimal to nil oral intake, underlying chronic conditions.   Code Status:    Code Status Orders  (From admission, onward)         Start     Ordered   01/27/19 1443  Do not attempt resuscitation (DNR)  Continuous    Question Answer Comment  In the event of cardiac or respiratory ARREST Do not call a "code blue"   In the event of cardiac or respiratory ARREST Do not perform Intubation, CPR, defibrillation or ACLS   In the event of cardiac or respiratory ARREST Use medication by any route, position, wound care, and other measures to relive pain and suffering. May use oxygen, suction and manual treatment of airway obstruction as needed for comfort.      01/27/19 1442        Code Status History    Date Active Date Inactive Code Status Order ID Comments User Context   01/25/2019 1728 01/27/2019 1442 Full Code [id number]  Truddie Hidden, MD Inpatient   11/13/2018 2357 11/18/2018 1333 Full Code [id number]  Shela Leff, MD ED   Advance Care Planning Activity       Prognosis:   < 2 weeks  Discharge Planning:  Hospice  facility  Care plan was discussed with  Patient, niece and 2 other family members.   Thank you for allowing the Palliative Medicine Team to assist in the care of this patient.   Time In: 11 Time Out: 11.50 Total Time 50 Prolonged Time Billed  no       Greater than 50%  of this time was spent counseling and coordinating care related to the above assessment and plan.  Loistine Chance, MD  Please contact Palliative Medicine Team phone at [phone] for questions and concerns.

## 2019-01-29 DIAGNOSIS — R05 Cough: Secondary | ICD-10-CM

## 2019-01-29 LAB — CBC
HCT: 37.6 % (ref 36.0–46.0)
Hemoglobin: 11.7 g/dL — ABNORMAL LOW (ref 12.0–15.0)
MCH: 29.2 pg (ref 26.0–34.0)
MCHC: 31.1 g/dL (ref 30.0–36.0)
MCV: 93.8 fL (ref 80.0–100.0)
Platelets: 303 10*3/uL (ref 150–400)
RBC: 4.01 MIL/uL (ref 3.87–5.11)
RDW: 13.9 % (ref 11.5–15.5)
WBC: 8.4 10*3/uL (ref 4.0–10.5)
nRBC: 0 % (ref 0.0–0.2)

## 2019-01-29 LAB — BASIC METABOLIC PANEL
Anion gap: 14 (ref 5–15)
BUN: 40 mg/dL — ABNORMAL HIGH (ref 8–23)
CO2: 33 mmol/L — ABNORMAL HIGH (ref 22–32)
Calcium: 8.5 mg/dL — ABNORMAL LOW (ref 8.9–10.3)
Chloride: 95 mmol/L — ABNORMAL LOW (ref 98–111)
Creatinine, Ser: 1.27 mg/dL — ABNORMAL HIGH (ref 0.44–1.00)
GFR calc Af Amer: 44 mL/min — ABNORMAL LOW (ref >60)
GFR calc non Af Amer: 38 mL/min — ABNORMAL LOW (ref >60)
Glucose, Bld: 152 mg/dL — ABNORMAL HIGH (ref 70–99)
Potassium: 3.6 mmol/L (ref 3.5–5.1)
Sodium: 142 mmol/L (ref 135–145)

## 2019-01-29 NOTE — TOC Progression Note (Addendum)
Transition of Care [facility]) - Progression Note    Patient Details  Name: Cheryl Graves MRN: [id number] Date of Birth: 1928/08/25  Transition of Care [facility]) CM/SW Contact  Servando Snare, [place] Phone Number: 01/29/2019, 12:05 PM  Clinical Narrative:   Patient awaiting approval and bed at [facility]. LCSW left message for follow up awaiting call back.   12:35 PM Patient will have bed today. Awaiting completion of paper by family.   Expected Discharge Plan: [place] Barriers to Discharge: No Barriers Identified  Expected Discharge Plan and Services Expected Discharge Plan: [place]   Discharge Planning Services: CM Consult   Living arrangements for the past 2 months: [place]                                       Social Determinants of Health (SDOH) Interventions    Readmission Risk Interventions No flowsheet data found.

## 2019-01-29 NOTE — Progress Notes (Signed)
PROGRESS NOTE    Cheryl Graves  ZOX:[id number] DOB: 1928/03/28 DOA: 01/25/2019 PCP: [name]   Brief Narrative:  Patient is a 85 year old female who is from [facility] in a [place] with medical history of COPD, proximal A. Fib, hypertension, diabetes type 2 who presented with shortness of breath.  As per the report, she was having orthopnea, swelling of lower extremities.  She follows with [name] as an outpatient for her COPD.  She is on inhalers at home and on 2 L of oxygen via nasal cannula.  Patient was also noted to be tachycardic at that nursing facility.  On emergency department ,chest x-ray showed possible congestive heart failure with bilateral pleural effusions.  She was started on IV Lasix. Also started on broad start antibiotics for possible pneumonia.  She has developed right-sided hemiparesis and facial droop , code stroke was called neurology consulted, CT  Head showed hyperdense left MCA compatible with acute embolus. CT angio of head and neck showed left M2 proximal branch occlusion. Dr Otelia Limes neurologist on-call suggest  plan for transfer to [facility] for urgent IR intervention.  Family does not want any intervention.  Palliative care consulted.  Family meeting arranged and completed. Residential hospice options discussed.  Family prefers hospice of [place].  Social worker is working on placement.  Assessment & Plan:   Active Problems:   Acute CHF (congestive heart failure) (HCC)  Acute left MCA stroke: She has developed right-sided hemiparesis and facial droop , code stroke was called neurology consulted, CT  Head showed hyperdense left MCA compatible with acute embolus. CT angio of head and neck showed left M2 proximal branch occlusion. Dr Otelia Limes neurologist on-call suggest  plan for transfer to [facility] for urgent IR intervention.  Family does not want any intervention.  Palliative care consulted.  Prognosis not more than 2  weeks due to extensive stroke,  minimal to no oral intake,  multiple comorbid conditions. Family agreement on Hospice.  Acute on chronic hypoxic respiratory failure: Secondary to COPD exacerbation but could be also due to pneumonia or new onset diastolic CHF.  Currently she is on 2 L of oxygen per minute which is her baseline.    Chest x-ray again  showed some pleural effusions. She is at baseline respiratory status.  Possible diastolic CHF: Echocardiogram showed normal left ventricular ejection fraction, mildly reduced right ventricular systolic function.  She was complaining of orthopnea and bilateral lower extremity edema. continue IV Lasix for now  . Continue to monitor daily weight, input/output. On reviewing her chest x-ray, she does not have significant pleural effusion for thoracentesis.  Continue IV diuretic treatment for now. Change to oral when appropriate.  COPD exacerbation: Follows with [name], pulmonology.  On 2 L of oxygen at baseline.  Continue bronchodilators as needed.  She is on inhalers at home.Started on steroids.  Possible community-acquired pneumonia: Chest x-ray on presentation could not  out pneumonia.  She is afebrile, no leukocytosis.  Continue current antibiotics for now.  CKD stage IIIa: Creatinine ranges from 1.2-1.5.  Currently kidney function at baseline.  Paroxysmal A. fib: Rate controlled.  On Eliquis.  On metoprolol for rate control.  Hypertension: Currently  stable.  Continue lisinopril, hydralazine, metoprolol  Diabetes type 2: Last hemoglobin C 5.7.  Not on oral agents at home.  Continue sliding-scale insulin   Hyponatremia: Currently stable  GERD: Continue PPI  Advanced age/debility/deconditioning: Resides in assisted living facility.  PT/OT evaluation done and recommended SNF.  Nutrition Problem: Increased nutrient needs Etiology: chronic illness   DVT prophylaxis:Eilquis Code Status: Full Family Communication:Called niece on  phone 01/26/19 Disposition Plan: Home with home hospice.   Consultants:   Neurology  Procedures: Antimicrobials:  Anti-infectives (From admission, onward)   Start     Dose/Rate Route Frequency Ordered Stop   01/26/19 1800  azithromycin (ZITHROMAX) tablet 500 mg     500 mg Oral Daily 01/25/19 1728 01/30/19 0959   01/25/19 2000  cefTRIAXone (ROCEPHIN) 1 g in sodium chloride 0.9 % 100 mL IVPB     1 g 200 mL/hr over 30 Minutes Intravenous Every 24 hours 01/25/19 1728     01/25/19 1800  azithromycin (ZITHROMAX) 500 mg in sodium chloride 0.9 % 250 mL IVPB     500 mg 250 mL/hr over 60 Minutes Intravenous Every 24 hours 01/25/19 1728 01/25/19 2100     Subjective: Patient was seen and examined at bedside, she has right-sided weakness.  Lying comfortably on the bed.  She denies any other complaints.  Objective: Vitals:   01/29/19 0823 01/29/19 0837 01/29/19 1234 01/29/19 1337  BP:  (!) 121/56 (!) 120/54   Pulse: 100 97 75 72  Resp: 18 18 18 18   Temp:  97.6 F (36.4 C) 97.8 F (36.6 C)   TempSrc:  Oral Oral   SpO2: 99% 98% 99% 98%  Weight:      Height:        Intake/Output Summary (Last 24 hours) at 01/29/2019 1450 Last data filed at 01/29/2019 [id number] Gross per 24 hour  Intake 0 ml  Output 1500 ml  Net -1500 ml   Filed Weights   01/27/19 0500 01/28/19 0500 01/29/19 0500  Weight: 86.8 kg 90 kg 87 kg    Examination:  General exam: Appears calm and comfortable  Respiratory system: Clear to auscultation. Respiratory effort normal. Cardiovascular system: S1 & S2 heard, RRR. No JVD, murmurs, rubs, gallops or clicks. No pedal edema. Gastrointestinal system: Abdomen is nondistended, soft and nontender. No organomegaly or masses felt. Normal bowel sounds heard. Central nervous system: Alert and oriented.  Right-sided weakness.   Extremities:  no edema, no clubbing no cyanosis Skin: No rashes, lesions or ulcers Psychiatry:  Mood & affect appropriate.     Data Reviewed: I  have personally reviewed following labs and imaging studies  CBC: Recent Labs  Lab 01/25/19 1039 01/25/19 1752 01/26/19 0642 01/28/19 0610 01/29/19 0551  WBC 9.0 5.1 8.7 9.3 8.4  NEUTROABS 7.4  --  6.8 6.6  --   HGB 11.6* 11.4* 10.8* 11.4* 11.7*  HCT 37.2 36.8 35.1* 35.9* 37.6  MCV 94.2 94.8 94.4 92.8 93.8  PLT 329 310 307 334 303   Basic Metabolic Panel: Recent Labs  Lab 01/25/19 1039 01/25/19 1752 01/26/19 0642 01/27/19 0612 01/28/19 0610 01/29/19 0551  NA 137  --  138 138 140 142  K 5.4*  --  4.0 3.3* 4.4 3.6  CL 103  --  97* 95* 96* 95*  CO2 25  --  29 31 31  33*  GLUCOSE 194*  --  144* 135* 138* 152*  BUN 21  --  27* 42* 39* 40*  CREATININE 1.16* 1.24* 1.35* 1.26* 1.29* 1.27*  CALCIUM 8.6*  --  8.6* 8.1* 8.1* 8.5*   GFR: Estimated Creatinine Clearance: 32.7 mL/min (A) (by C-G formula based on SCr of 1.27 mg/dL (H)). Liver Function Tests: No results for input(s): AST, ALT, ALKPHOS, BILITOT, PROT, ALBUMIN in the last 168 hours. No results for  input(s): LIPASE, AMYLASE in the last 168 hours. No results for input(s): AMMONIA in the last 168 hours. Coagulation Profile: No results for input(s): INR, PROTIME in the last 168 hours. Cardiac Enzymes: No results for input(s): CKTOTAL, CKMB, CKMBINDEX, TROPONINI in the last 168 hours. BNP (last 3 results) No results for input(s): PROBNP in the last 8760 hours. HbA1C: No results for input(s): HGBA1C in the last 72 hours. CBG: Recent Labs  Lab 01/27/19 1338  GLUCAP 318*   Lipid Profile: No results for input(s): CHOL, HDL, LDLCALC, TRIG, CHOLHDL, LDLDIRECT in the last 72 hours. Thyroid Function Tests: No results for input(s): TSH, T4TOTAL, FREET4, T3FREE, THYROIDAB in the last 72 hours. Anemia Panel: No results for input(s): VITAMINB12, FOLATE, FERRITIN, TIBC, IRON, RETICCTPCT in the last 72 hours. Sepsis Labs: Recent Labs  Lab 01/25/19 1752 01/26/19 0642 01/27/19 0612  PROCALCITON <0.10 <0.10 <0.10     Recent Results (from the past 240 hour(s))  Respiratory Panel by RT PCR (Flu A&B, Covid) - Nasopharyngeal Swab     Status: None   Collection Time: 01/25/19 10:39 AM   Specimen: Nasopharyngeal Swab  Result Value Ref Range Status   SARS Coronavirus 2 by RT PCR NEGATIVE NEGATIVE Final    Comment: (NOTE) SARS-CoV-2 target nucleic acids are NOT DETECTED. The SARS-CoV-2 RNA is generally detectable in upper respiratoy specimens during the acute phase of infection. The lowest concentration of SARS-CoV-2 viral copies this assay can detect is 131 copies/mL. A negative result does not preclude SARS-Cov-2 infection and should not be used as the sole basis for treatment or other patient management decisions. A negative result may occur with  improper specimen collection/handling, submission of specimen other than nasopharyngeal swab, presence of viral mutation(s) within the areas targeted by this assay, and inadequate number of viral copies (<131 copies/mL). A negative result must be combined with clinical observations, patient history, and epidemiological information. The expected result is Negative. Fact Sheet for Patients:  PinkCheek.be Fact Sheet for Healthcare Providers:  GravelBags.it This test is not yet ap proved or cleared by the [place] FDA and  has been authorized for detection and/or diagnosis of SARS-CoV-2 by FDA under an Emergency Use Authorization (EUA). This EUA will remain  in effect (meaning this test can be used) for the duration of the COVID-19 declaration under Section 564(b)(1) of the Act, 21 U.S.C. section 360bbb-3(b)(1), unless the authorization is terminated or revoked sooner.    Influenza A by PCR NEGATIVE NEGATIVE Final   Influenza B by PCR NEGATIVE NEGATIVE Final    Comment: (NOTE) The Xpert Xpress SARS-CoV-2/FLU/RSV assay is intended as an aid in  the diagnosis of influenza from Nasopharyngeal  swab specimens and  should not be used as a sole basis for treatment. Nasal washings and  aspirates are unacceptable for Xpert Xpress SARS-CoV-2/FLU/RSV  testing. Fact Sheet for Patients: PinkCheek.be Fact Sheet for Healthcare Providers: GravelBags.it This test is not yet approved or cleared by the [place] FDA and  has been authorized for detection and/or diagnosis of SARS-CoV-2 by  FDA under an Emergency Use Authorization (EUA). This EUA will remain  in effect (meaning this test can be used) for the duration of the  Covid-19 declaration under Section 564(b)(1) of the Act, 21  U.S.C. section 360bbb-3(b)(1), unless the authorization is  terminated or revoked. Performed at [facility], [place] [address] [place], [place] [address]   Respiratory Panel by PCR     Status: None   Collection Time: 01/27/19  4:11  AM   Specimen: Nasopharyngeal Swab; Respiratory  Result Value Ref Range Status   Adenovirus NOT DETECTED NOT DETECTED Final   Coronavirus 229E NOT DETECTED NOT DETECTED Final    Comment: (NOTE) The Coronavirus on the Respiratory Panel, DOES NOT test for the novel  Coronavirus (2019 nCoV)    Coronavirus HKU1 NOT DETECTED NOT DETECTED Final   Coronavirus NL63 NOT DETECTED NOT DETECTED Final   Coronavirus OC43 NOT DETECTED NOT DETECTED Final   Metapneumovirus NOT DETECTED NOT DETECTED Final   Rhinovirus / Enterovirus NOT DETECTED NOT DETECTED Final   Influenza A NOT DETECTED NOT DETECTED Final   Influenza B NOT DETECTED NOT DETECTED Final   Parainfluenza Virus 1 NOT DETECTED NOT DETECTED Final   Parainfluenza Virus 2 NOT DETECTED NOT DETECTED Final   Parainfluenza Virus 3 NOT DETECTED NOT DETECTED Final   Parainfluenza Virus 4 NOT DETECTED NOT DETECTED Final   Respiratory Syncytial Virus NOT DETECTED NOT DETECTED Final   Bordetella pertussis NOT DETECTED NOT DETECTED Final   Chlamydophila pneumoniae  NOT DETECTED NOT DETECTED Final   Mycoplasma pneumoniae NOT DETECTED NOT DETECTED Final    Comment: Performed at [facility], [address] [place], [place] [address]         Radiology Studies: MR BRAIN WO CONTRAST  Result Date: 01/27/2019 CLINICAL DATA:  Stroke.  Right-sided weakness EXAM: MRI HEAD WITHOUT CONTRAST TECHNIQUE: Multiplanar, multiecho pulse sequences of the brain and surrounding structures were obtained without intravenous contrast. COMPARISON:  CT head earlier today FINDINGS: Brain: Acute infarct in the deep white matter on the left involving the external capsule and corona radiata. Small areas of acute infarct in the left parietal cortex over the convexity. Moderate atrophy without hydrocephalus. No intracranial hemorrhage or mass. Chronic microvascular ischemic changes in the white matter. Chronic ischemia in the pons. Vascular: Loss of flow void left internal carotid artery compatible with slow flow or occlusion. Otherwise normal flow voids. Skull and upper cervical spine: No focal skeletal abnormality. Sinuses/Orbits: Bilateral cataract surgery.  Paranasal sinuses clear Other: None IMPRESSION: Acute infarct in the deep white matter on the left. Small areas of acute infarct in the left parietal cortex over the convexity Loss of flow void left internal carotid artery compatible with slow flow or occlusion. Electronically Signed   By: Marlan Palau M.D.   On: 01/27/2019 15:34        Scheduled Meds: . aspirin EC  81 mg Oral Daily  . azithromycin  500 mg Oral Daily  . budesonide (PULMICORT) nebulizer solution  0.25 mg Nebulization BID  . cholecalciferol  1,000 Units Oral Daily  . insulin aspart  10 Units Intravenous Once   And  . dextrose  1 ampule Intravenous Once  . feeding supplement (ENSURE ENLIVE)  237 mL Oral BID BM  . furosemide  40 mg Intravenous Daily  . hydrALAZINE  50 mg Oral TID  . levalbuterol  0.63 mg Nebulization TID  . lisinopril  40 mg Oral  Daily  . Melatonin  2 mg Oral QHS  . metoprolol tartrate  2.5 mg Intravenous Q12H  . mirabegron ER  25 mg Oral Daily  . pantoprazole  40 mg Oral BID  . predniSONE  40 mg Oral Q breakfast  . vitamin C  1,000 mg Oral Daily   Continuous Infusions: . sodium chloride Stopped (01/25/19 2100)  . cefTRIAXone (ROCEPHIN)  IV 1 g (01/28/19 2032)     LOS: 4 days    Time spent:  Shawna Clamp, MD [facility]   If 7PM-7AM, please contact night-coverage

## 2019-01-29 NOTE — Progress Notes (Signed)
Daily Progress Note   Patient Name: Cheryl Graves       Date: 01/29/2019 DOB: [birth year]  Age: 85 y.o. MRN#: [id number] Attending Physician: Cipriano Bunker, MD Primary Care Physician: [name] Admit Date: 01/25/2019  Reason for Consultation/Follow-up: Establishing goals of care  Subjective: Awake, slurred speech.  Not able to verbalize well.  Confused at times. +cough   Length of Stay: 4  Current Medications: Scheduled Meds:  . aspirin EC  81 mg Oral Daily  . azithromycin  500 mg Oral Daily  . budesonide (PULMICORT) nebulizer solution  0.25 mg Nebulization BID  . cholecalciferol  1,000 Units Oral Daily  . insulin aspart  10 Units Intravenous Once   And  . dextrose  1 ampule Intravenous Once  . feeding supplement (ENSURE ENLIVE)  237 mL Oral BID BM  . furosemide  40 mg Intravenous Daily  . hydrALAZINE  50 mg Oral TID  . levalbuterol  0.63 mg Nebulization TID  . lisinopril  40 mg Oral Daily  . Melatonin  2 mg Oral QHS  . metoprolol tartrate  2.5 mg Intravenous Q12H  . mirabegron ER  25 mg Oral Daily  . pantoprazole  40 mg Oral BID  . predniSONE  40 mg Oral Q breakfast  . vitamin C  1,000 mg Oral Daily    Continuous Infusions: . sodium chloride Stopped (01/25/19 2100)  . cefTRIAXone (ROCEPHIN)  IV 1 g (01/28/19 2032)    PRN Meds: acetaminophen, dextromethorphan, dextromethorphan-guaiFENesin, HYDROmorphone (DILAUDID) injection, levalbuterol, ondansetron (ZOFRAN) IV, ondansetron  Physical Exam         Frail, weak Shallow breathing S1-S2 Generalized weakness Abdomen not distended No edema  Vital Signs: BP (!) 121/56   Pulse 97   Temp 97.6 F (36.4 C) (Oral)   Resp 18   Ht 5' 4.02" (1.626 m)   Wt 87 kg   SpO2 98%   BMI 32.91  kg/m  SpO2: SpO2: 98 % O2 Device: O2 Device: Nasal Cannula O2 Flow Rate: O2 Flow Rate (L/min): 2 L/min  Intake/output summary:   Intake/Output Summary (Last 24 hours) at 01/29/2019 1157 Last data filed at 01/29/2019 0927 Gross per 24 hour  Intake 113 ml  Output 2400 ml  Net -2287 ml   LBM: Last BM Date: 01/25/19 Baseline Weight: Weight: 72.3  kg Most recent weight: Weight: 87 kg       Palliative Assessment/Data:      Patient Active Problem List   Diagnosis Date Noted  . Acute CHF (congestive heart failure) ([place]) 01/25/2019  . Hyponatremia 11/14/2018  . Type 2 diabetes mellitus ([place]) 11/14/2018  . Physical deconditioning 11/14/2018  . Hypertensive urgency 11/14/2018  . UTI (urinary tract infection) 11/13/2018    Palliative Care Assessment & Plan   Patient Profile: 85 year old lady with COPD, atrial fibrillation, hypertension, diabetes.  Admitted with acute on chronic hypoxic respiratory failure, COPD exacerbation acute heart failure exacerbation.  Hospital course complicated by patient developing acute stroke here in the hospital found to have severe left MCA syndrome cerebrovascular accident deemed embolic in nature, MRI also showing atrophy.  Palliative medicine team following for goals of care discussions.  Assessment: Frail, weak Generalized weakness Slurred speech Difficulty swallowing Confused.  Family Meeting/Recommendations/Plan:   awaiting residential hospice transfer Continue current mode of care.    Code Status:    Code Status Orders  (From admission, onward)         Start     Ordered   01/27/19 1443  Do not attempt resuscitation (DNR)  Continuous    Question Answer Comment  In the event of cardiac or respiratory ARREST Do not call a "code blue"   In the event of cardiac or respiratory ARREST Do not perform Intubation, CPR, defibrillation or ACLS   In the event of cardiac or respiratory ARREST Use medication by any route, position, wound  care, and other measures to relive pain and suffering. May use oxygen, suction and manual treatment of airway obstruction as needed for comfort.      01/27/19 1442        Code Status History    Date Active Date Inactive Code Status Order ID Comments User Context   01/25/2019 1728 01/27/2019 1442 Full Code [id number]  Truddie Hidden, MD Inpatient   11/13/2018 2357 11/18/2018 1333 Full Code [id number]  Shela Leff, MD ED   Advance Care Planning Activity       Prognosis:   < 2 weeks  Discharge Planning:  Hospice facility  Care plan was discussed with   RN  Thank you for allowing the Palliative Medicine Team to assist in the care of this patient.   Time In: 11 Time Out: 11.15 Total Time 15 Prolonged Time Billed  no       Greater than 50%  of this time was spent counseling and coordinating care related to the above assessment and plan.  Loistine Chance, MD  Please contact Palliative Medicine Team phone at [phone] for questions and concerns.

## 2019-01-29 NOTE — TOC Progression Note (Signed)
Transition of Care [facility]) - Progression Note    Patient Details  Name: Cheryl Graves MRN: [id number] Date of Birth: [birth year]  [facility] CM/SW Contact  Servando Snare, LCSW Phone Number: 01/29/2019, 4:24 PM  Clinical Narrative:   LCSW received call from [place] at [facility]. Family is scheduled to complete paperwork int he morning. Patient can dc in the AM.     Expected Discharge Plan: [place] Barriers to Discharge: No Barriers Identified  Expected Discharge Plan and Services Expected Discharge Plan: [place]   Discharge Planning Services: CM Consult   Living arrangements for the past 2 months: Skilled Nursing Facility                                       Social Determinants of Health (SDOH) Interventions    Readmission Risk Interventions No flowsheet data found.

## 2019-01-30 LAB — BASIC METABOLIC PANEL
Anion gap: 12 (ref 5–15)
BUN: 40 mg/dL — ABNORMAL HIGH (ref 8–23)
CO2: 34 mmol/L — ABNORMAL HIGH (ref 22–32)
Calcium: 8.5 mg/dL — ABNORMAL LOW (ref 8.9–10.3)
Chloride: 95 mmol/L — ABNORMAL LOW (ref 98–111)
Creatinine, Ser: 1.23 mg/dL — ABNORMAL HIGH (ref 0.44–1.00)
GFR calc Af Amer: 45 mL/min — ABNORMAL LOW (ref >60)
GFR calc non Af Amer: 39 mL/min — ABNORMAL LOW (ref >60)
Glucose, Bld: 142 mg/dL — ABNORMAL HIGH (ref 70–99)
Potassium: 3.2 mmol/L — ABNORMAL LOW (ref 3.5–5.1)
Sodium: 141 mmol/L (ref 135–145)

## 2019-01-30 MED ORDER — ENSURE ENLIVE PO LIQD: 237.0000 mL | Freq: Two times a day (BID) | ORAL | 12 refills | Status: AC

## 2019-01-30 MED ORDER — LEVALBUTEROL HCL 0.63 MG/3ML IN NEBU: 0.6300 mg | INHALATION_SOLUTION | Freq: Three times a day (TID) | RESPIRATORY_TRACT | 12 refills | Status: DC

## 2019-01-30 MED ORDER — PREDNISONE 20 MG PO TABS: 40.0000 mg | ORAL_TABLET | Freq: Every day | ORAL | 0 refills | Status: DC

## 2019-01-30 MED ORDER — ASPIRIN 81 MG PO TBEC: 81.0000 mg | DELAYED_RELEASE_TABLET | Freq: Every day | ORAL | 0 refills | Status: DC

## 2019-01-30 NOTE — Discharge Instructions (Signed)
Patient has been discharged to residential hospice.

## 2019-01-30 NOTE — Progress Notes (Signed)
Report given to [facility] [place].

## 2019-01-30 NOTE — Discharge Summary (Signed)
Physician Discharge Summary  Cheryl Graves XTG:[id number] DOB: 30-Jul-1928 DOA: 01/25/2019  PCP: [place], [place] date: 01/25/2019   Discharge date: 01/30/2019  Admitted From:   SNF Disposition:  Residential Hospice. ([facility].  Recommendations for Outpatient Follow-up:  1. Follow up with PCP in 1-2 weeks 2. Please obtain BMP/CBC in one week 3. Please follow up on the following pending results:  Home Health: Residential hospice.  Equipment/Devices: Oxygen @ 2l/min   Discharge Condition: Fair  CODE STATUS:DNR   Diet recommendation: Heart Healthy / Carb Modified / Regular / Dysphagia   Brief/Interim Summary: Patient is a 85 year old female who is from [facility] in a [place] with medical history of COPD, proximal A. Fib, hypertension, diabetes type 2 who presented with shortness of breath. As per the report, she was having orthopnea, swelling of lower extremities. She follows with Dr. Halford Chessman as an outpatient for her COPD. She is on inhalers at home and on 2 L of oxygen via nasal cannula. Patient was also noted to be tachycardic at that nursing facility. On emergency department ,chest x-ray showed possible congestive heart failure with bilateral pleural effusions. She was started on IV Lasix. Also started on broad start antibiotics for possible pneumonia.  she was admitted under hospitalist service.   Hospital course She has developed right-sided hemiparesis and facial droop , code stroke was called,  neurology consulted, CT Head showed hyperdense left MCA compatible with acute embolus. CT angio of head and neck showed left M2 proximal branch occlusion. Dr Cheral Marker neurologist on-call suggest  planfor transfer to [facility] for urgent IR intervention.  Family does not want any  Aggressive intervention.  Palliative care consulted.  Family meeting arranged and completed. Residential hospice options discussed.  Family prefers  [facility] [place].   [facility] [place] [facility].   Palliative care discussion: Family wants  Comfort feeds, resume safest possible PO options to honor the patient's wishes.   No artificial feeding tubes.   Residential hospice: Options and choices discussed.  Family prefers [facility] [place].  Request social work consult to facilitate  Continue current mode of care for now.   Prognosis not more than 2 weeks due to extensive stroke, minimal to nil oral intake, underlying chronic conditions.  She was managed for below problems.   Assessment & Plan:   Active Problems:   Acute CHF (congestive heart failure) (HCC)  Acute left MCA stroke: She has developed right-sided hemiparesis and facial droop , code stroke was called neurology consulted, CT Head showed hyperdense left MCA compatible with acute embolus. CT angio of head and neck showed left M2 proximal branch occlusion. Dr Cheral Marker neurologist on-call suggest  planfor transfer to [facility] for urgent IR intervention.  Family does not want any intervention.  Palliative care consulted.  Prognosis not more than 2 weeks due to extensive stroke minimal to no oral intake multiple comorbid conditions. Family agreement on Hospice.  Acute on chronic hypoxic respiratory failure: Secondary to COPD exacerbation but could be also due to pneumonia or new onset diastolic CHF. Currently she is on 2 L of oxygen per minute which is her baseline.  Chest x-ray again showed some pleural effusions. She is at baseline respiratory status.  Possible diastolic EVO:[id number] showed normal left ventricular ejection fraction, mildly reduced right ventricular systolic function. She was complaining of orthopnea and bilateral lower extremity edema. continue IV Lasix for now .Continue to monitor daily weight, input/output. On reviewing her  chest x-ray, she does not have significant pleural effusion for thoracentesis.  Continue IVdiuretic treatment for now. Change to oral when appropriate.  COPD exacerbation:Follows with Dr. Halford Chessman, pulmonology. On 2 L of oxygen at baseline. Continue bronchodilators as needed. She is on inhalers at home.Started on steroids.  Possible community-acquired pneumonia:Chest x-ray on presentationcould not out pneumonia. She is afebrile, no leukocytosis. she received five days of antibiotics in hospital.  CKD stage IIIa: Creatinine ranges from 1.2-1.5. Currently kidney function at baseline.  Paroxysmal A. fib: Rate controlled. On Eliquis. On metoprolol for rate control.  Hypertension:Currently stable. Continue lisinopril, hydralazine, metoprolol  Diabetes type 2:Last hemoglobin C 5.7. Not on oral agents at home. Continue sliding-scale insulin   Hyponatremia: Currently stable  GERD: Continue PPI  Advanced age/debility/deconditioning: Resides in assisted living facility. PT/OT evaluation done and recommended SNF.  Nutrition Problem: Increased nutrient needs Etiology: chronic illness   Discharge Diagnoses:  Active Problems:   Acute CHF (congestive heart failure) [facility])    Discharge Instructions  Discharge Instructions    Call MD for:  difficulty breathing, headache or visual disturbances   Complete by: As directed    Call MD for:  persistant dizziness or light-headedness   Complete by: As directed    Call MD for:  persistant nausea and vomiting   Complete by: As directed    Call MD for:  temperature >100.4   Complete by: As directed    Diet - low sodium heart healthy   Complete by: As directed    Discharge instructions   Complete by: As directed    Advised to follow up PCP in one week. Patient has been transferred to residential hospice.   Increase activity slowly   Complete by: As directed      Allergies as of 01/30/2019      Reactions   Ciprofloxacin Diarrhea, Nausea Only   Metronidazole Diarrhea, Nausea Only   Septra  [sulfamethoxazole-trimethoprim] Diarrhea      Medication List    STOP taking these medications   ivermectin 3 MG Tabs tablet Commonly known as: STROMECTOL   lisinopril 40 MG tablet Commonly known as: ZESTRIL     TAKE these medications   acetaminophen 325 MG tablet Commonly known as: TYLENOL Take 650 mg by mouth every 6 (six) hours as needed for moderate pain.   apixaban 2.5 MG Tabs tablet Commonly known as: Eliquis Take 1 tablet (2.5 mg total) by mouth 2 (two) times daily.   aspirin 81 MG EC tablet Take 1 tablet (81 mg total) by mouth daily. Start taking on: January 31, 2019   cholecalciferol 25 MCG (1000 UT) tablet Commonly known as: VITAMIN D Take 1,000 Units by mouth daily.   Combivent Respimat 20-100 MCG/ACT Aers respimat Generic drug: Ipratropium-Albuterol Inhale 1-2 puffs into the lungs every 6 (six) hours as needed for shortness of breath or wheezing.   dextromethorphan 30 MG/5ML liquid Commonly known as: DELSYM Take 60 mg by mouth every 12 (twelve) hours as needed for cough.   dextromethorphan-guaiFENesin 30-600 MG 12hr tablet Commonly known as: MUCINEX DM Take 1 tablet by mouth every 12 (twelve) hours as needed for cough.   feeding supplement (ENSURE ENLIVE) Liqd Take 237 mLs by mouth 2 (two) times daily between meals.   hydrALAZINE 50 MG tablet Commonly known as: APRESOLINE Take 1 tablet (50 mg total) by mouth 3 (three) times daily.   levalbuterol 0.63 MG/3ML nebulizer solution Commonly known as: XOPENEX Take 3 mLs (0.63 mg total) by nebulization 3 (three) times  daily.   Melatonin 1 MG Tabs Take 2 mg by mouth at bedtime.   metoprolol tartrate 25 MG tablet Commonly known as: LOPRESSOR Take 0.5 tablets (12.5 mg total) by mouth 2 (two) times daily.   mirabegron ER 25 MG Tb24 tablet Commonly known as: MYRBETRIQ Take 1 tablet (25 mg total) by mouth daily.   ondansetron 4 MG tablet Commonly known as: ZOFRAN Take 4 mg by mouth every 8 (eight)  hours as needed for nausea or vomiting.   pantoprazole 40 MG tablet Commonly known as: PROTONIX Take 40 mg by mouth 2 (two) times daily.   predniSONE 20 MG tablet Commonly known as: DELTASONE Take 2 tablets (40 mg total) by mouth daily with breakfast. Start taking on: January 31, 2019   sodium chloride 1 g tablet Take 1 g by mouth 3 (three) times daily.   Spiriva Respimat 2.5 MCG/ACT Aers Generic drug: Tiotropium Bromide Monohydrate Inhale 2 puffs into the lungs daily.   traZODone 50 MG tablet Commonly known as: DESYREL Take 25 mg by mouth at bedtime.   vitamin C 500 MG tablet Commonly known as: ASCORBIC ACID Take 1,000 mg by mouth daily.   Vitamin Deficiency System-B12 1000 MCG/ML Kit Generic drug: Cyanocobalamin Inject 1,000 mcg as directed every 30 (thirty) days.   zinc gluconate 50 MG tablet Take 50 mg by mouth daily.      Follow-up Information    [place] Follow up in 1 week(s).   Specialty: Family Medicine Contact information: [address] [place] [address] [phone]          Allergies  Allergen Reactions  . Ciprofloxacin Diarrhea and Nausea Only  . Metronidazole Diarrhea and Nausea Only  . Septra [Sulfamethoxazole-Trimethoprim] Diarrhea    Consultations:   Neurology.  Palliative care.   Procedures/Studies: CT ANGIO HEAD W OR WO CONTRAST  Result Date: 01/27/2019 CLINICAL DATA:  Stroke, follow-up. Additional history provided: Acute onset right-sided facial droop, right upper and lower extremity weakness, unable to communicate, last known normal 12:30 p.m. EXAM: CT ANGIOGRAPHY HEAD AND NECK TECHNIQUE: Multidetector CT imaging of the head and neck was performed using the standard protocol during bolus administration of intravenous contrast. Multiplanar CT image reconstructions and MIPs were obtained to evaluate the vascular anatomy. Carotid stenosis measurements (when applicable) are obtained  utilizing NASCET criteria, using the distal internal carotid diameter as the denominator. CONTRAST:  38m OMNIPAQUE IOHEXOL 350 MG/ML SOLN COMPARISON:  None. FINDINGS: CTA NECK FINDINGS Aortic arch: Standard aortic branching. Soft and calcified plaque within the visualized aortic arch and proximal major branch vessels of the neck. Right carotid system: Proximal and mid common carotid artery patent without significant stenosis. Prominent calcified plaque at the carotid bifurcation extending into the proximal ICA. Estimated stenosis of up to 70% as compared to the more distal ICA, although this may be overestimated due to blooming artifact from prominent calcified plaque. Distal to this, the ICA is patent within the neck without significant stenosis. Left carotid system: Soft and calcified plaque within the proximal CCA with less than 50% stenosis. Prominent soft and calcified plaque within the distal CCA extending into the left ICA origin. Resultant high-grade stenosis at the left ICA origin. Exact degree of stenosis is difficult to quantify due to blooming from prominent calcified plaque. Distal to this, the ICA is markedly diminutive within the neck. Minimal ICA enhancement throughout the siphon region and within the supraclinoid segment. There is little flow related enhancement within the cavernous through  supraclinoid internal carotid artery. Vertebral arteries: The vertebral arteries are codominant. Mixed plaque results in sites of moderate/severe stenosis within the V1 right vertebral artery and severe stenosis within the V1 left vertebral artery. Distal to this, the bilateral vertebral arteries are patent within the neck without significant stenosis (50% or greater). Skeleton: No acute bony abnormality. Cervical spondylosis with multilevel posterior disc osteophytes, uncovertebral and facet hypertrophy. Other neck: No neck mass or cervical lymphadenopathy. Upper chest: Partially imaged large bilateral pleural  effusions. Edema also present within the bilateral lung apices. Review of the MIP images confirms the above findings CTA HEAD FINDINGS Anterior circulation: There is minimal enhancement within the intracranial internal carotid artery. Paucity of enhancement is most notable within the paraclinoid through supraclinoid segments. The intracranial right internal carotid artery is patent with scattered calcified plaque, but no significant stenosis. There is more robust reconstitution of the M1 left middle cerebral artery, likely via the anterior communicating artery and left A1 segment. The M1 left middle cerebral artery is patent without significant stenosis. There is occlusion of a proximal M2 MCA branch vessel shortly beyond its origin (series 10, image 96). The left anterior cerebral artery is patent. High-grade focal stenosis within the proximal A1 left ACA. The right middle and anterior cerebral arteries are patent without significant proximal stenosis. No intracranial aneurysm is identified. Posterior circulation: The intracranial vertebral arteries are patent without significant stenosis, as is the basilar artery. The posterior cerebral arteries are patent bilaterally without significant proximal stenosis. Posterior communicating arteries are poorly delineated and may be hypoplastic or absent bilaterally. Venous sinuses: Within limitations of contrast timing, no convincing thrombus. Anatomic variants: As described Review of the MIP images confirms the above findings Findings of severe left cervical ICA stenosis with minimal enhancement of the intracranial left internal carotid artery, as well as proximal M2 left MCA occlusion called by telephone at the time of interpretation on 01/27/2019 at 2:56 pm to provider Dr. Shelly Coss , Who verbally acknowledged these results. IMPRESSION: CTA neck: 1. Atherosclerotic disease within the visualized aortic arch and the branch vessels of the neck, as detailed and most  notably as follows. 2. Prominent calcified plaque within the distal left CCA and proximal ICA with high-grade stenosis of the ICA origin. Distal to this, the ICA remains markedly diminutive within the neck. 3. Prominent calcified plaque within the distal right CCA and proximal ICA with estimated stenosis up to 70% stenosis as compared to the more distal ICA. However, this may be overestimated due to blooming artifact from prominent calcified plaque. Consider carotid artery duplex for further evaluation. 4. Mixed plaque within the V1 vertebral arteries bilaterally with sites of moderate/severe stenosis on the right and severe stenosis on the left. 5. Partially visualized pulmonary edema and large bilateral pleural effusions. CTA head: 1. Minimal enhancement within the intracranial left internal carotid artery with paucity of enhancement most notable in the cavernous through supraclinoid segments. More robust reconstitution of flow within the M1 left middle cerebral artery, likely via cross flow from the anterior communicating artery and A1 left ACA. 2. Occlusion of an M2 left middle cerebral artery branch vessel, shortly beyond its origin. 3. High-grade focal stenosis within the proximal A1 left anterior cerebral artery. Electronically Signed   By: Kellie Simmering DO   On: 01/27/2019 15:23   DG Chest 1 View  Result Date: 01/27/2019 CLINICAL DATA:  Shortness of breath EXAM: CHEST  1 VIEW COMPARISON:  Two days ago FINDINGS: Cardiomegaly. Layering pleural effusions and congested  appearance of vessels. No pneumothorax. No air bronchogram although airspace disease at the lung bases could be obscured. IMPRESSION: CHF pattern including pleural effusions. No convincing progression from 2 days ago. Electronically Signed   By: Monte Fantasia M.D.   On: 01/27/2019 08:56   CT ANGIO NECK W OR WO CONTRAST  Result Date: 01/27/2019 CLINICAL DATA:  Stroke, follow-up. Additional history provided: Acute onset right-sided facial  droop, right upper and lower extremity weakness, unable to communicate, last known normal 12:30 p.m. EXAM: CT ANGIOGRAPHY HEAD AND NECK TECHNIQUE: Multidetector CT imaging of the head and neck was performed using the standard protocol during bolus administration of intravenous contrast. Multiplanar CT image reconstructions and MIPs were obtained to evaluate the vascular anatomy. Carotid stenosis measurements (when applicable) are obtained utilizing NASCET criteria, using the distal internal carotid diameter as the denominator. CONTRAST:  33m OMNIPAQUE IOHEXOL 350 MG/ML SOLN COMPARISON:  None. FINDINGS: CTA NECK FINDINGS Aortic arch: Standard aortic branching. Soft and calcified plaque within the visualized aortic arch and proximal major branch vessels of the neck. Right carotid system: Proximal and mid common carotid artery patent without significant stenosis. Prominent calcified plaque at the carotid bifurcation extending into the proximal ICA. Estimated stenosis of up to 70% as compared to the more distal ICA, although this may be overestimated due to blooming artifact from prominent calcified plaque. Distal to this, the ICA is patent within the neck without significant stenosis. Left carotid system: Soft and calcified plaque within the proximal CCA with less than 50% stenosis. Prominent soft and calcified plaque within the distal CCA extending into the left ICA origin. Resultant high-grade stenosis at the left ICA origin. Exact degree of stenosis is difficult to quantify due to blooming from prominent calcified plaque. Distal to this, the ICA is markedly diminutive within the neck. Minimal ICA enhancement throughout the siphon region and within the supraclinoid segment. There is little flow related enhancement within the cavernous through supraclinoid internal carotid artery. Vertebral arteries: The vertebral arteries are codominant. Mixed plaque results in sites of moderate/severe stenosis within the V1 right  vertebral artery and severe stenosis within the V1 left vertebral artery. Distal to this, the bilateral vertebral arteries are patent within the neck without significant stenosis (50% or greater). Skeleton: No acute bony abnormality. Cervical spondylosis with multilevel posterior disc osteophytes, uncovertebral and facet hypertrophy. Other neck: No neck mass or cervical lymphadenopathy. Upper chest: Partially imaged large bilateral pleural effusions. Edema also present within the bilateral lung apices. Review of the MIP images confirms the above findings CTA HEAD FINDINGS Anterior circulation: There is minimal enhancement within the intracranial internal carotid artery. Paucity of enhancement is most notable within the paraclinoid through supraclinoid segments. The intracranial right internal carotid artery is patent with scattered calcified plaque, but no significant stenosis. There is more robust reconstitution of the M1 left middle cerebral artery, likely via the anterior communicating artery and left A1 segment. The M1 left middle cerebral artery is patent without significant stenosis. There is occlusion of a proximal M2 MCA branch vessel shortly beyond its origin (series 10, image 96). The left anterior cerebral artery is patent. High-grade focal stenosis within the proximal A1 left ACA. The right middle and anterior cerebral arteries are patent without significant proximal stenosis. No intracranial aneurysm is identified. Posterior circulation: The intracranial vertebral arteries are patent without significant stenosis, as is the basilar artery. The posterior cerebral arteries are patent bilaterally without significant proximal stenosis. Posterior communicating arteries are poorly delineated and may be hypoplastic  or absent bilaterally. Venous sinuses: Within limitations of contrast timing, no convincing thrombus. Anatomic variants: As described Review of the MIP images confirms the above findings Findings of  severe left cervical ICA stenosis with minimal enhancement of the intracranial left internal carotid artery, as well as proximal M2 left MCA occlusion called by telephone at the time of interpretation on 01/27/2019 at 2:56 pm to provider Dr. Shelly Coss , Who verbally acknowledged these results. IMPRESSION: CTA neck: 1. Atherosclerotic disease within the visualized aortic arch and the branch vessels of the neck, as detailed and most notably as follows. 2. Prominent calcified plaque within the distal left CCA and proximal ICA with high-grade stenosis of the ICA origin. Distal to this, the ICA remains markedly diminutive within the neck. 3. Prominent calcified plaque within the distal right CCA and proximal ICA with estimated stenosis up to 70% stenosis as compared to the more distal ICA. However, this may be overestimated due to blooming artifact from prominent calcified plaque. Consider carotid artery duplex for further evaluation. 4. Mixed plaque within the V1 vertebral arteries bilaterally with sites of moderate/severe stenosis on the right and severe stenosis on the left. 5. Partially visualized pulmonary edema and large bilateral pleural effusions. CTA head: 1. Minimal enhancement within the intracranial left internal carotid artery with paucity of enhancement most notable in the cavernous through supraclinoid segments. More robust reconstitution of flow within the M1 left middle cerebral artery, likely via cross flow from the anterior communicating artery and A1 left ACA. 2. Occlusion of an M2 left middle cerebral artery branch vessel, shortly beyond its origin. 3. High-grade focal stenosis within the proximal A1 left anterior cerebral artery. Electronically Signed   By: Kellie Simmering DO   On: 01/27/2019 15:23   MR BRAIN WO CONTRAST  Result Date: 01/27/2019 CLINICAL DATA:  Stroke.  Right-sided weakness EXAM: MRI HEAD WITHOUT CONTRAST TECHNIQUE: Multiplanar, multiecho pulse sequences of the brain and  surrounding structures were obtained without intravenous contrast. COMPARISON:  CT head earlier today FINDINGS: Brain: Acute infarct in the deep white matter on the left involving the external capsule and corona radiata. Small areas of acute infarct in the left parietal cortex over the convexity. Moderate atrophy without hydrocephalus. No intracranial hemorrhage or mass. Chronic microvascular ischemic changes in the white matter. Chronic ischemia in the pons. Vascular: Loss of flow void left internal carotid artery compatible with slow flow or occlusion. Otherwise normal flow voids. Skull and upper cervical spine: No focal skeletal abnormality. Sinuses/Orbits: Bilateral cataract surgery.  Paranasal sinuses clear Other: None IMPRESSION: Acute infarct in the deep white matter on the left. Small areas of acute infarct in the left parietal cortex over the convexity Loss of flow void left internal carotid artery compatible with slow flow or occlusion. Electronically Signed   By: Franchot Gallo M.D.   On: 01/27/2019 15:34   DG Chest Port 1 View  Result Date: 01/25/2019 CLINICAL DATA:  Shortness of breath and AFib. EXAM: PORTABLE CHEST 1 VIEW COMPARISON:  12/14/2018 FINDINGS: Cardiomediastinal contours are enlarged. There is diffuse interstitial prominence with graded opacity in the left and right chest along with opacification over right hemidiaphragm and left chest in the retrocardiac region. No sign of acute bone process. IMPRESSION: 1. Worsening generalized increased density at the lung bases may reflect enlarging effusions in this patient likely with heart failure or volume overload. 2. Difficult to exclude infection at the lung bases. Electronically Signed   By: Zetta Bills M.D.   On: 01/25/2019  11:13   ECHOCARDIOGRAM COMPLETE  Result Date: 01/25/2019   ECHOCARDIOGRAM REPORT   Patient Name:   Cheryl Graves Date of Exam: 01/25/2019 Medical Rec #:  [id number]       Height:       64.0 in Accession #:     [id number]      Weight:       159.4 lb Date of Birth:  [birth year]      BSA:          1.78 m Patient Age:    46 years        BP:           150/84 mmHg Patient Gender: F               HR:           109 bpm. Exam Location:  Inpatient Procedure: 2D Echo, Cardiac Doppler and Color Doppler Indications:     I48.0 Paroxysmal atrial fibrillation  History:         Patient has no prior history of Echocardiogram examinations.                  Risk Factors:Hypertension and Diabetes.  Sonographer:     Jonelle Sidle Dance Referring Phys:  [id number] Truddie Hidden Diagnosing Phys: Loralie Champagne MD IMPRESSIONS  1. Left ventricular ejection fraction, by visual estimation, is 60 to 65%. The left ventricle has normal function. There is no left ventricular hypertrophy.  2. The left ventricle has no regional wall motion abnormalities.  3. Left ventricular diastolic parameters are indeterminate.  4. Global right ventricle has mildly reduced systolic function.The right ventricular size is normal. No increase in right ventricular wall thickness.  5. Left atrial size was mild-moderately dilated.  6. Right atrial size was mildly dilated.  7. Moderate mitral annular calcification.  8. The mitral valve is normal in structure. Mild to moderate mitral valve regurgitation. No evidence of mitral stenosis.  9. The tricuspid valve is normal in structure. Tricuspid valve regurgitation is mild. 10. The aortic valve is tricuspid. Aortic valve regurgitation is trivial. Mild aortic valve sclerosis without stenosis. 11. The tricuspid regurgitant velocity is 2.45 m/s, and with an assumed right atrial pressure of 15 mmHg, the estimated right ventricular systolic pressure is mildly elevated at 38.9 mmHg. 12. The inferior vena cava is dilated in size with <50% respiratory variability, suggesting right atrial pressure of 15 mmHg. 13. Trivial pericardial effusion is present. 14. Left-sided pleural effusion is present. 15. The patient was in atrial fibrillation.  FINDINGS  Left Ventricle: Left ventricular ejection fraction, by visual estimation, is 60 to 65%. The left ventricle has normal function. The left ventricle has no regional wall motion abnormalities. The left ventricular internal cavity size was the left ventricle is normal in size. There is no left ventricular hypertrophy. Left ventricular diastolic parameters are indeterminate. Right Ventricle: The right ventricular size is normal. No increase in right ventricular wall thickness. Global RV systolic function is has mildly reduced systolic function. The tricuspid regurgitant velocity is 2.45 m/s, and with an assumed right atrial pressure of 15 mmHg, the estimated right ventricular systolic pressure is mildly elevated at 38.9 mmHg. Left Atrium: Left atrial size was mild-moderately dilated. Right Atrium: Right atrial size was mildly dilated Pericardium: Trivial pericardial effusion is present. Left-sided pleural effusion is present. Mitral Valve: The mitral valve is normal in structure. There is mild calcification of the mitral valve leaflet(s). Moderate mitral annular calcification. No evidence of mitral valve stenosis  by observation. Mild to moderate mitral valve regurgitation. Tricuspid Valve: The tricuspid valve is normal in structure. Tricuspid valve regurgitation is mild. Aortic Valve: The aortic valve is tricuspid. Aortic valve regurgitation is trivial. Mild aortic valve sclerosis is present, with no evidence of aortic valve stenosis. Pulmonic Valve: The pulmonic valve was normal in structure. Pulmonic valve regurgitation is not visualized. Aorta: The aortic root is normal in size and structure. Venous: The inferior vena cava is dilated in size with less than 50% respiratory variability, suggesting right atrial pressure of 15 mmHg. IAS/Shunts: No atrial level shunt detected by color flow Doppler.  LEFT VENTRICLE PLAX 2D LVIDd:         4.30 cm LVIDs:         2.60 cm LV PW:         1.00 cm LV IVS:        1.10 cm  LVOT diam:     1.90 cm LV SV:         58 ml LV SV Index:   32.03 LVOT Area:     2.84 cm  RIGHT VENTRICLE            IVC RV Basal diam:  3.10 cm    IVC diam: 2.70 cm RV Mid diam:    2.20 cm RV S prime:     9.57 cm/s TAPSE (M-mode): 1.5 cm LEFT ATRIUM             Index       RIGHT ATRIUM           Index LA diam:        4.60 cm 2.59 cm/m  RA Area:     23.60 cm LA Vol (A2C):   75.7 ml 42.61 ml/m RA Volume:   81.30 ml  45.76 ml/m LA Vol (A4C):   35.1 ml 19.76 ml/m LA Biplane Vol: 53.7 ml 30.23 ml/m  AORTIC VALVE LVOT Vmax:   95.15 cm/s LVOT Vmean:  62.650 cm/s LVOT VTI:    0.152 m  AORTA Ao Root diam: 3.30 cm Ao Asc diam:  3.20 cm MITRAL VALVE                        TRICUSPID VALVE MV Area (PHT): 9.14 cm             TR Peak grad:   23.9 mmHg MV PHT:        24.07 msec           TR Vmax:        278.00 cm/s MV Decel Time: 83 msec MV E velocity: 135.00 cm/s 103 cm/s SHUNTS                                     Systemic VTI:  0.15 m                                     Systemic Diam: 1.90 cm  Loralie Champagne MD Electronically signed by Loralie Champagne MD Signature Date/Time: 01/25/2019/5:00:55 PM    Final (Updated)    CT HEAD CODE STROKE WO CONTRAST  Result Date: 01/27/2019 CLINICAL DATA:  Code stroke. Altered mental status. Right sided weakness and facial droop. EXAM: CT HEAD WITHOUT CONTRAST TECHNIQUE: Contiguous axial images were obtained from the base of  the skull through the vertex without intravenous contrast. COMPARISON:  None. FINDINGS: Brain: Moderate atrophy. Hypodensity throughout the cerebral white matter bilaterally most consistent with chronic microvascular ischemia. No acute cortical infarct. Negative for hemorrhage or mass. Vascular: Hyperdense left MCA in the sylvian fissure involving distal M1 and M2 segment. This is compatible with acute thrombus. Skull: Negative Sinuses/Orbits: Mild mucosal edema left frontal sinus. Bilateral cataract surgery Other: None ASPECTS ([place] Stroke Program Early CT Score)  - Ganglionic level infarction (caudate, lentiform nuclei, internal capsule, insula, M1-M3 cortex): 7 - Supraganglionic infarction (M4-M6 cortex): 3 Total score (0-10 with 10 being normal): 10 IMPRESSION: 1. Hyperdense left MCA compatible with acute embolus in this patient with atrial fibrillation. No evidence of acute ischemic cortical infarct 2. Atrophy and chronic microvascular ischemia 3. ASPECTS is 10 4. These results were called by telephone at the time of interpretation on 01/27/2019 at 2:10 pm to provider [facility] , who verbally acknowledged these results. Electronically Signed   By: Franchot Gallo M.D.   On: 01/27/2019 14:12       Subjective: Seen and examined at bedside,  she appears comfortable there were no overnight events.   Discharge Exam: Vitals:   01/30/19 0738 01/30/19 0759  BP:  (!) 152/91  Pulse:  (!) 53  Resp:  18  Temp:  98.9 F (37.2 C)  SpO2: 97% 100%   Vitals:   01/30/19 0500 01/30/19 0601 01/30/19 0738 01/30/19 0759  BP:  (!) 145/74  (!) 152/91  Pulse:  87  (!) 53  Resp:    18  Temp:    98.9 F (37.2 C)  TempSrc:      SpO2:   97% 100%  Weight: 88 kg     Height:        General: Pt is alert, awake, not in acute distress Cardiovascular: RRR, S1/S2 +, no rubs, no gallops Respiratory: CTA bilaterally, no wheezing, no rhonchi Abdominal: Soft, NT, ND, bowel sounds + Extremities: no edema, no cyanosis    The results of significant diagnostics from this hospitalization (including imaging, microbiology, ancillary and laboratory) are listed below for reference.     Microbiology: Recent Results (from the past 240 hour(s))  Respiratory Panel by RT PCR (Flu A&B, Covid) - Nasopharyngeal Swab     Status: None   Collection Time: 01/25/19 10:39 AM   Specimen: Nasopharyngeal Swab  Result Value Ref Range Status   SARS Coronavirus 2 by RT PCR NEGATIVE NEGATIVE Final    Comment: (NOTE) SARS-CoV-2 target nucleic acids are NOT DETECTED. The SARS-CoV-2 RNA is  generally detectable in upper respiratoy specimens during the acute phase of infection. The lowest concentration of SARS-CoV-2 viral copies this assay can detect is 131 copies/mL. A negative result does not preclude SARS-Cov-2 infection and should not be used as the sole basis for treatment or other patient management decisions. A negative result may occur with  improper specimen collection/handling, submission of specimen other than nasopharyngeal swab, presence of viral mutation(s) within the areas targeted by this assay, and inadequate number of viral copies (<131 copies/mL). A negative result must be combined with clinical observations, patient history, and epidemiological information. The expected result is Negative. Fact Sheet for Patients:  PinkCheek.be Fact Sheet for Healthcare Providers:  GravelBags.it This test is not yet ap proved or cleared by the [place] FDA and  has been authorized for detection and/or diagnosis of SARS-CoV-2 by FDA under an Emergency Use Authorization (EUA). This EUA will remain  in effect (meaning  this test can be used) for the duration of the COVID-19 declaration under Section 564(b)(1) of the Act, 21 U.S.C. section 360bbb-3(b)(1), unless the authorization is terminated or revoked sooner.    Influenza A by PCR NEGATIVE NEGATIVE Final   Influenza B by PCR NEGATIVE NEGATIVE Final    Comment: (NOTE) The Xpert Xpress SARS-CoV-2/FLU/RSV assay is intended as an aid in  the diagnosis of influenza from Nasopharyngeal swab specimens and  should not be used as a sole basis for treatment. Nasal washings and  aspirates are unacceptable for Xpert Xpress SARS-CoV-2/FLU/RSV  testing. Fact Sheet for Patients: PinkCheek.be Fact Sheet for Healthcare Providers: GravelBags.it This test is not yet approved or cleared by the [place] FDA and   has been authorized for detection and/or diagnosis of SARS-CoV-2 by  FDA under an Emergency Use Authorization (EUA). This EUA will remain  in effect (meaning this test can be used) for the duration of the  Covid-19 declaration under Section 564(b)(1) of the Act, 21  U.S.C. section 360bbb-3(b)(1), unless the authorization is  terminated or revoked. Performed at [facility], [place] [address], [place], [place] [address]   Respiratory Panel by PCR     Status: None   Collection Time: 01/27/19  4:11 AM   Specimen: Nasopharyngeal Swab; Respiratory  Result Value Ref Range Status   Adenovirus NOT DETECTED NOT DETECTED Final   Coronavirus 229E NOT DETECTED NOT DETECTED Final    Comment: (NOTE) The Coronavirus on the Respiratory Panel, DOES NOT test for the novel  Coronavirus (2019 nCoV)    Coronavirus HKU1 NOT DETECTED NOT DETECTED Final   Coronavirus NL63 NOT DETECTED NOT DETECTED Final   Coronavirus OC43 NOT DETECTED NOT DETECTED Final   Metapneumovirus NOT DETECTED NOT DETECTED Final   Rhinovirus / Enterovirus NOT DETECTED NOT DETECTED Final   Influenza A NOT DETECTED NOT DETECTED Final   Influenza B NOT DETECTED NOT DETECTED Final   Parainfluenza Virus 1 NOT DETECTED NOT DETECTED Final   Parainfluenza Virus 2 NOT DETECTED NOT DETECTED Final   Parainfluenza Virus 3 NOT DETECTED NOT DETECTED Final   Parainfluenza Virus 4 NOT DETECTED NOT DETECTED Final   Respiratory Syncytial Virus NOT DETECTED NOT DETECTED Final   Bordetella pertussis NOT DETECTED NOT DETECTED Final   Chlamydophila pneumoniae NOT DETECTED NOT DETECTED Final   Mycoplasma pneumoniae NOT DETECTED NOT DETECTED Final    Comment: Performed at [facility], [place]. [address], [place], [place] [address]     Labs: BNP (last 3 results) Recent Labs    01/25/19 1039  BNP 802.2*   Basic Metabolic Panel: Recent Labs  Lab 01/26/19 0642 01/27/19 0612 01/28/19 0610 01/29/19 0551 01/30/19 0625  NA  138 138 140 142 141  K 4.0 3.3* 4.4 3.6 3.2*  CL 97* 95* 96* 95* 95*  CO2 _0 33* 34*  GLUCOSE 144* 135* 138* 152* 142*  BUN 27* 42* 39* 40* 40*  CREATININE 1.35* 1.26* 1.29* 1.27* 1.23*  CALCIUM 8.6* 8.1* 8.1* 8.5* 8.5*   Liver Function Tests: No results for input(s): AST, ALT, ALKPHOS, BILITOT, PROT, ALBUMIN in the last 168 hours. No results for input(s): LIPASE, AMYLASE in the last 168 hours. No results for input(s): AMMONIA in the last 168 hours. CBC: Recent Labs  Lab 01/25/19 1039 01/25/19 1752 01/26/19 0642 01/28/19 0610 01/29/19 0551  WBC 9.0 5.1 8.7 9.3 8.4  NEUTROABS 7.4  --  6.8 6.6  --   HGB 11.6* 11.4* 10.8* 11.4*  11.7*  HCT 37.2 36.8 35.1* 35.9* 37.6  MCV 94.2 94.8 94.4 92.8 93.8  PLT 329 310 307 334 303   Cardiac Enzymes: No results for input(s): CKTOTAL, CKMB, CKMBINDEX, TROPONINI in the last 168 hours. BNP: Invalid input(s): POCBNP CBG: Recent Labs  Lab 01/27/19 1338  GLUCAP 318*   D-Dimer No results for input(s): DDIMER in the last 72 hours. Hgb A1c No results for input(s): HGBA1C in the last 72 hours. Lipid Profile No results for input(s): CHOL, HDL, LDLCALC, TRIG, CHOLHDL, LDLDIRECT in the last 72 hours. Thyroid function studies No results for input(s): TSH, T4TOTAL, T3FREE, THYROIDAB in the last 72 hours.  Invalid input(s): FREET3 Anemia work up No results for input(s): VITAMINB12, FOLATE, FERRITIN, TIBC, IRON, RETICCTPCT in the last 72 hours. Urinalysis    Component Value Date/Time   COLORURINE YELLOW 11/13/2018 2200   APPEARANCEUR CLOUDY (A) 11/13/2018 2200   LABSPEC 1.009 11/13/2018 2200   PHURINE 6.0 11/13/2018 2200   GLUCOSEU NEGATIVE 11/13/2018 2200   HGBUR SMALL (A) 11/13/2018 2200   BILIRUBINUR NEGATIVE 11/13/2018 2200   KETONESUR NEGATIVE 11/13/2018 2200   PROTEINUR NEGATIVE 11/13/2018 2200   NITRITE POSITIVE (A) 11/13/2018 2200   LEUKOCYTESUR LARGE (A) 11/13/2018 2200   Sepsis Labs Invalid input(s): PROCALCITONIN,   WBC,  LACTICIDVEN Microbiology Recent Results (from the past 240 hour(s))  Respiratory Panel by RT PCR (Flu A&B, Covid) - Nasopharyngeal Swab     Status: None   Collection Time: 01/25/19 10:39 AM   Specimen: Nasopharyngeal Swab  Result Value Ref Range Status   SARS Coronavirus 2 by RT PCR NEGATIVE NEGATIVE Final    Comment: (NOTE) SARS-CoV-2 target nucleic acids are NOT DETECTED. The SARS-CoV-2 RNA is generally detectable in upper respiratoy specimens during the acute phase of infection. The lowest concentration of SARS-CoV-2 viral copies this assay can detect is 131 copies/mL. A negative result does not preclude SARS-Cov-2 infection and should not be used as the sole basis for treatment or other patient management decisions. A negative result may occur with  improper specimen collection/handling, submission of specimen other than nasopharyngeal swab, presence of viral mutation(s) within the areas targeted by this assay, and inadequate number of viral copies (<131 copies/mL). A negative result must be combined with clinical observations, patient history, and epidemiological information. The expected result is Negative. Fact Sheet for Patients:  PinkCheek.be Fact Sheet for Healthcare Providers:  GravelBags.it This test is not yet ap proved or cleared by the [place] FDA and  has been authorized for detection and/or diagnosis of SARS-CoV-2 by FDA under an Emergency Use Authorization (EUA). This EUA will remain  in effect (meaning this test can be used) for the duration of the COVID-19 declaration under Section 564(b)(1) of the Act, 21 U.S.C. section 360bbb-3(b)(1), unless the authorization is terminated or revoked sooner.    Influenza A by PCR NEGATIVE NEGATIVE Final   Influenza B by PCR NEGATIVE NEGATIVE Final    Comment: (NOTE) The Xpert Xpress SARS-CoV-2/FLU/RSV assay is intended as an aid in  the diagnosis of  influenza from Nasopharyngeal swab specimens and  should not be used as a sole basis for treatment. Nasal washings and  aspirates are unacceptable for Xpert Xpress SARS-CoV-2/FLU/RSV  testing. Fact Sheet for Patients: PinkCheek.be Fact Sheet for Healthcare Providers: GravelBags.it This test is not yet approved or cleared by the [place] FDA and  has been authorized for detection and/or diagnosis of SARS-CoV-2 by  FDA under an Emergency Use Authorization (EUA). This EUA will remain  in effect (meaning this test can be used) for the duration of the  Covid-19 declaration under Section 564(b)(1) of the Act, 21  U.S.C. section 360bbb-3(b)(1), unless the authorization is  terminated or revoked. Performed at [facility], [place] [address] [place], [place] [address]   Respiratory Panel by PCR     Status: None   Collection Time: 01/27/19  4:11 AM   Specimen: Nasopharyngeal Swab; Respiratory  Result Value Ref Range Status   Adenovirus NOT DETECTED NOT DETECTED Final   Coronavirus 229E NOT DETECTED NOT DETECTED Final    Comment: (NOTE) The Coronavirus on the Respiratory Panel, DOES NOT test for the novel  Coronavirus (2019 nCoV)    Coronavirus HKU1 NOT DETECTED NOT DETECTED Final   Coronavirus NL63 NOT DETECTED NOT DETECTED Final   Coronavirus OC43 NOT DETECTED NOT DETECTED Final   Metapneumovirus NOT DETECTED NOT DETECTED Final   Rhinovirus / Enterovirus NOT DETECTED NOT DETECTED Final   Influenza A NOT DETECTED NOT DETECTED Final   Influenza B NOT DETECTED NOT DETECTED Final   Parainfluenza Virus 1 NOT DETECTED NOT DETECTED Final   Parainfluenza Virus 2 NOT DETECTED NOT DETECTED Final   Parainfluenza Virus 3 NOT DETECTED NOT DETECTED Final   Parainfluenza Virus 4 NOT DETECTED NOT DETECTED Final   Respiratory Syncytial Virus NOT DETECTED NOT DETECTED Final   Bordetella pertussis NOT DETECTED NOT DETECTED  Final   Chlamydophila pneumoniae NOT DETECTED NOT DETECTED Final   Mycoplasma pneumoniae NOT DETECTED NOT DETECTED Final    Comment: Performed at [facility], [place]. [address], [place], [place] [address]     Time coordinating discharge: Over 30 minutes  SIGNED:   Shawna Clamp, MD  [facility] 01/30/2019, 11:45 AM Pager   If 7PM-7AM, please contact night-coverage [email or web address]

## 2019-01-30 NOTE — Progress Notes (Signed)
Nsg Discharge Note  Admit Date:  01/25/2019 Discharge date: 01/30/2019   Cheryl Graves to be D/C'd to residential hospice per MD order.  AVS completed.  Copy for chart, and copy for patient signed, and dated. Patient/caregiver able to verbalize understanding.  Discharge Medication: Allergies as of 01/30/2019      Reactions   Ciprofloxacin Diarrhea, Nausea Only   Metronidazole Diarrhea, Nausea Only   Septra [sulfamethoxazole-trimethoprim] Diarrhea      Medication List    STOP taking these medications   ivermectin 3 MG Tabs tablet Commonly known as: STROMECTOL   lisinopril 40 MG tablet Commonly known as: ZESTRIL     TAKE these medications   acetaminophen 325 MG tablet Commonly known as: TYLENOL Take 650 mg by mouth every 6 (six) hours as needed for moderate pain.   apixaban 2.5 MG Tabs tablet Commonly known as: Eliquis Take 1 tablet (2.5 mg total) by mouth 2 (two) times daily.   aspirin 81 MG EC tablet Take 1 tablet (81 mg total) by mouth daily. Start taking on: January 31, 2019   cholecalciferol 25 MCG (1000 UT) tablet Commonly known as: VITAMIN D Take 1,000 Units by mouth daily.   Combivent Respimat 20-100 MCG/ACT Aers respimat Generic drug: Ipratropium-Albuterol Inhale 1-2 puffs into the lungs every 6 (six) hours as needed for shortness of breath or wheezing.   dextromethorphan 30 MG/5ML liquid Commonly known as: DELSYM Take 60 mg by mouth every 12 (twelve) hours as needed for cough.   dextromethorphan-guaiFENesin 30-600 MG 12hr tablet Commonly known as: MUCINEX DM Take 1 tablet by mouth every 12 (twelve) hours as needed for cough.   feeding supplement (ENSURE ENLIVE) Liqd Take 237 mLs by mouth 2 (two) times daily between meals.   hydrALAZINE 50 MG tablet Commonly known as: APRESOLINE Take 1 tablet (50 mg total) by mouth 3 (three) times daily.   levalbuterol 0.63 MG/3ML nebulizer solution Commonly known as: XOPENEX Take 3 mLs (0.63 mg total) by  nebulization 3 (three) times daily.   Melatonin 1 MG Tabs Take 2 mg by mouth at bedtime.   metoprolol tartrate 25 MG tablet Commonly known as: LOPRESSOR Take 0.5 tablets (12.5 mg total) by mouth 2 (two) times daily.   mirabegron ER 25 MG Tb24 tablet Commonly known as: MYRBETRIQ Take 1 tablet (25 mg total) by mouth daily.   ondansetron 4 MG tablet Commonly known as: ZOFRAN Take 4 mg by mouth every 8 (eight) hours as needed for nausea or vomiting.   pantoprazole 40 MG tablet Commonly known as: PROTONIX Take 40 mg by mouth 2 (two) times daily.   predniSONE 20 MG tablet Commonly known as: DELTASONE Take 2 tablets (40 mg total) by mouth daily with breakfast. Start taking on: January 31, 2019   sodium chloride 1 g tablet Take 1 g by mouth 3 (three) times daily.   Spiriva Respimat 2.5 MCG/ACT Aers Generic drug: Tiotropium Bromide Monohydrate Inhale 2 puffs into the lungs daily.   traZODone 50 MG tablet Commonly known as: DESYREL Take 25 mg by mouth at bedtime.   vitamin C 500 MG tablet Commonly known as: ASCORBIC ACID Take 1,000 mg by mouth daily.   Vitamin Deficiency System-B12 1000 MCG/ML Kit Generic drug: Cyanocobalamin Inject 1,000 mcg as directed every 30 (thirty) days.   zinc gluconate 50 MG tablet Take 50 mg by mouth daily.       Discharge Assessment: Vitals:   01/30/19 0759 01/30/19 1200  BP: (!) 152/91 133/66  Pulse: (!) 53  81  Resp: 18 16  Temp: 98.9 F (37.2 C) (!) 97.5 F (36.4 C)  SpO2: 100% 100%   Skin clean, dry and intact without evidence of skin break down, no evidence of skin tears noted. IV catheter discontinued intact. Site without signs and symptoms of complications - no redness or edema noted at insertion site, patient denies c/o pain - only slight tenderness at site.  Dressing with slight pressure applied.  D/c Instructions-Education: Discharge instructions given to patient/family with verbalized understanding. D/c education completed  with patient/family including follow up instructions, medication list, d/c activities limitations if indicated, with other d/c instructions as indicated by MD - patient able to verbalize understanding, all questions fully answered. Patient instructed to return to ED, call 911, or call MD for any changes in condition.  Patient escorted via stretcher, and D/C home via [facility].  Eustace Pen, RN 01/30/2019 3:00 PM

## 2019-01-30 NOTE — TOC Transition Note (Signed)
Transition of Care [facility]) - CM/SW Discharge Note   Patient Details  Name: AEVA POSEY MRN: [id number] Date of Birth: 01/07/1929  [facility] CM/SW Contact:  Servando Snare, LCSW Phone Number: 01/30/2019, 12:25 PM   Clinical Narrative:   Patient will transport to [facility] facility. LCSW faxed dc documents to facility. RN report number: [phone]      Barriers to Discharge: No Barriers Identified   Patient Goals and CMS Choice        Discharge Placement                       Discharge Plan and Services   Discharge Planning Services: CM Consult                                 Social Determinants of Health (SDOH) Interventions     Readmission Risk Interventions No flowsheet data found.

## 2019-02-03 ENCOUNTER — Ambulatory Visit: Payer: Medicare Other | Admitting: Pulmonary Disease

## 2019-04-13 ENCOUNTER — Ambulatory Visit (HOSPITAL_COMMUNITY)
Admission: RE | Admit: 2019-04-13 | Discharge: 2019-04-13 | Disposition: A | Discharge status: Home / Self Care | Payer: No Typology Code available for payment source | Source: Ambulatory Visit | Attending: Nurse Practitioner | Admitting: Nurse Practitioner

## 2019-04-13 ENCOUNTER — Other Ambulatory Visit: Payer: Self-pay

## 2019-04-13 ENCOUNTER — Encounter (HOSPITAL_COMMUNITY): Payer: Self-pay | Admitting: Nurse Practitioner

## 2019-04-13 VITALS — BP 150/70 | HR 102 | Ht 64.0 in | Wt 161.6 lb

## 2019-04-13 DIAGNOSIS — K5792 Diverticulitis of intestine, part unspecified, without perforation or abscess without bleeding: Secondary | ICD-10-CM | POA: Insufficient documentation

## 2019-04-13 DIAGNOSIS — Z7901 Long term (current) use of anticoagulants: Secondary | ICD-10-CM | POA: Diagnosis not present

## 2019-04-13 DIAGNOSIS — I4819 Other persistent atrial fibrillation: Secondary | ICD-10-CM | POA: Diagnosis present

## 2019-04-13 DIAGNOSIS — D6869 Other thrombophilia: Secondary | ICD-10-CM | POA: Diagnosis not present

## 2019-04-13 DIAGNOSIS — Z79899 Other long term (current) drug therapy: Secondary | ICD-10-CM | POA: Diagnosis not present

## 2019-04-13 DIAGNOSIS — E119 Type 2 diabetes mellitus without complications: Secondary | ICD-10-CM | POA: Insufficient documentation

## 2019-04-13 DIAGNOSIS — Z8673 Personal history of transient ischemic attack (TIA), and cerebral infarction without residual deficits: Secondary | ICD-10-CM | POA: Diagnosis not present

## 2019-04-13 DIAGNOSIS — Z87891 Personal history of nicotine dependence: Secondary | ICD-10-CM | POA: Diagnosis not present

## 2019-04-13 DIAGNOSIS — I10 Essential (primary) hypertension: Secondary | ICD-10-CM | POA: Diagnosis not present

## 2019-04-13 NOTE — Progress Notes (Signed)
Primary Care Physician: [name] Referring Physician: [facility] ER f/u    Cheryl Graves is a 86 y.o. female with a h/o T2DM, hyponatremia, recent past pneumonia and current treatment of UTI. Pt was just seen in the ER for HR's in the 100-140's reported at nursing facility. She  was in new onset afib. Was already on metoprolol. They deferred starting anticoagulation with a CHA2DS2VASc score of at least 5.   She is in afib clinic today with her health care power of attorney, her niece, Pricilla Riffle. She  is asymptomatic with afib, rate controlled. Her V/S  from the nursing facility show BP's 102 systolic to 135 systolic with an  average around 116. Her HR is mostly in the 70's/80's with highest rate of 94 bpm.   I discussed her stroke risk and the pt and her niece were clear that she wanted to do what she could to avoid stroke. They both deny active falls. She is fairly sedentary. She just went to the nursing facility a month ago after developing pneumonia and could not take care of herself as she lived alone. She will be staying there as her permanent residence. She denies a bleeding history.  F/u in afib clinic, 11/23. She is reasonably rate controlled. She does not feel the afib. She is doing well on anticoagulation, no reported bleeding issues.   F/u in afib clinic, 04/13/19. 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 She was given a poor prognosis and went to Hospice. After a month in hospice  with improvement, she returned to her SNF. She  is now non  ambulatory. She  is alert today and answers  simple questions. Her niece reports that she has issues with comprehension and speech along with rt  sided weakness since CVA. She was continued in rate controlled afib. ASA was added to her eliquis 2.5 mg bid.   Today, she denies symptoms of palpitations,  chest pain, shortness of breath, orthopnea, PND, lower extremity edema, dizziness, presyncope, syncope, or neurologic sequela. The patient is tolerating medications without difficulties and is otherwise without complaint today.   Past Medical History:  Diagnosis Date  . Diabetes mellitus without complication (HCC)   . Diverticulitis   . Hypertension    Past Surgical History:  Procedure Laterality Date  . APPENDECTOMY    . TONSILLECTOMY      Current Outpatient Medications  Medication Sig Dispense Refill  . acetaminophen (TYLENOL) 325 MG tablet Take 500 mg by mouth every 8 (eight) hours as needed for moderate pain.     Marland Kitchen antiseptic oral rinse (BIOTENE) LIQD 15 mLs by Mouth Rinse route every 6 (six) hours as needed for dry mouth.    Marland Kitchen apixaban (ELIQUIS) 2.5 MG TABS tablet Take 1 tablet (2.5 mg total) by mouth 2 (two) times daily. 60 tablet 6  . atorvastatin (LIPITOR) 20 MG tablet Take 20 mg by mouth daily.    . cholecalciferol (VITAMIN D) 25 MCG (1000 UT) tablet Take 1,000 Units by mouth daily.    . COMBIVENT RESPIMAT 20-100 MCG/ACT AERS respimat Inhale 1-2 puffs into the lungs every 6 (six) hours as needed for shortness of breath or wheezing.    . feeding supplement, ENSURE ENLIVE, (ENSURE ENLIVE) LIQD Take 237 mLs by mouth 2 (two) times daily between meals. 237 mL 12  . guaifenesin (ROBITUSSIN) 100 MG/5ML syrup Take 200  mg by mouth every 4 (four) hours as needed for cough.    . hydrALAZINE (APRESOLINE) 50 MG tablet Take 1 tablet (50 mg total) by mouth 3 (three) times daily.    . Melatonin 1 MG TABS Take 6 mg by mouth at bedtime.     . metoprolol tartrate (LOPRESSOR) 25 MG tablet Take 0.5 tablets (12.5 mg total) by mouth 2 (two) times daily. 30 tablet 0  . polyethylene glycol (MIRALAX / GLYCOLAX) 17 g packet Take 17 g by mouth daily as needed.    . Skin Protectants, Misc. (CALAZIME SKIN PROTECTANT) PSTE Apply topically.    . traZODone (DESYREL) 50 MG tablet Take 25 mg by mouth at bedtime.      . vitamin C (ASCORBIC ACID) 500 MG tablet Take 1,000 mg by mouth daily.    Marland Kitchen zinc gluconate 50 MG tablet Take 50 mg by mouth daily.     No current facility-administered medications for this encounter.    Allergies  Allergen Reactions  . Ciprofloxacin Diarrhea and Nausea Only  . Metronidazole Diarrhea and Nausea Only  . Septra [Sulfamethoxazole-Trimethoprim] Diarrhea    Social History   Socioeconomic History  . Marital status: Single    Spouse name: Not on file  . Number of children: Not on file  . Years of education: Not on file  . Highest education level: Not on file  Occupational History  . Not on file  Tobacco Use  . Smoking status: Former Smoker    Quit date: 11/20/2018    Years since quitting: 0.3  . Smokeless tobacco: Never Used  Substance and Sexual Activity  . Alcohol use: No  . Drug use: No  . Sexual activity: Not on file  Other Topics Concern  . Not on file  Social History Narrative  . Not on file   Social Determinants of Health   Financial Resource Strain:   . Difficulty of Paying Living Expenses: Not on file  Food Insecurity:   . Worried About Charity fundraiser in the Last Year: Not on file  . Ran Out of Food in the Last Year: Not on file  Transportation Needs:   . Lack of Transportation (Medical): Not on file  . Lack of Transportation (Non-Medical): Not on file  Physical Activity:   . Days of Exercise per Week: Not on file  . Minutes of Exercise per Session: Not on file  Stress:   . Feeling of Stress : Not on file  Social Connections:   . Frequency of Communication with Friends and Family: Not on file  . Frequency of Social Gatherings with Friends and Family: Not on file  . Attends Religious Services: Not on file  . Active Member of Clubs or Organizations: Not on file  . Attends Archivist Meetings: Not on file  . Marital Status: Not on file  Intimate Partner Violence:   . Fear of Current or Ex-Partner: Not on file  .  Emotionally Abused: Not on file  . Physically Abused: Not on file  . Sexually Abused: Not on file    No family history on file.  ROS- All systems are reviewed and negative except as per the HPI above  Physical Exam: Vitals:   04/13/19 1026  BP: (!) 150/70  Pulse: (!) 102  Weight: 73.3 kg  Height: 5\' 4"  (1.626 m)   Wt Readings from Last 3 Encounters:  04/13/19 73.3 kg  01/30/19 88 kg  01/10/19 72.3 kg    Labs: Lab  Results  Component Value Date   NA 141 01/30/2019   K 3.2 (L) 01/30/2019   CL 95 (L) 01/30/2019   CO2 34 (H) 01/30/2019   GLUCOSE 142 (H) 01/30/2019   BUN 40 (H) 01/30/2019   CREATININE 1.23 (H) 01/30/2019   CALCIUM 8.5 (L) 01/30/2019   MG 1.8 12/14/2018   Lab Results  Component Value Date   INR 1.0 12/14/2018   No results found for: CHOL, HDL, LDLCALC, TRIG   GEN- The patient is well appearing, alert and oriented x 3 today.   Head- normocephalic, atraumatic Eyes-  Sclera clear, conjunctiva pink Ears- hearing intact Oropharynx- clear Neck- supple, no JVP Lymph- no cervical lymphadenopathy Lungs- Clear to ausculation bilaterally, normal work of breathing Heart- Regular rate and rhythm, no murmurs, rubs or gallops, PMI not laterally displaced GI- soft, NT, ND, + BS Extremities- no clubbing, cyanosis, or edema MS- no significant deformity or atrophy Skin- no rash or lesion Psych- euthymic mood, full affect Neuro- strength and sensation are intact  EKG-afib at 96 bpm, qrs int 90 ms, qtc 442 ms    Assessment and Plan: 1. Persistent fib  Continue metoprolol 12.5 mg bid  At this point, rate control is  her goal  2. HTN Stable  3. CHA2DS2VASc score of  5 Denies bleeding history Continue eliquis 2.5 mg bid    4. CVA Continue asa  afib clinic as needed, otherwise her care is being addressed thru her nursing facility    Lupita Leash [facility] [address] [place], [place] [address] [phone]

## 2019-09-14 ENCOUNTER — Ambulatory Visit: Payer: Medicare Other | Admitting: Primary Care

## 2019-09-26 ENCOUNTER — Other Ambulatory Visit: Payer: Self-pay

## 2019-09-26 ENCOUNTER — Encounter: Payer: Self-pay | Admitting: Adult Health

## 2019-09-26 ENCOUNTER — Ambulatory Visit (INDEPENDENT_AMBULATORY_CARE_PROVIDER_SITE_OTHER): Payer: Medicare Other | Admitting: Adult Health

## 2019-09-26 VITALS — BP 118/66 | HR 63 | Temp 98.2°F | Ht 60.0 in | Wt 120.0 lb

## 2019-09-26 DIAGNOSIS — J181 Lobar pneumonia, unspecified organism: Secondary | ICD-10-CM | POA: Diagnosis not present

## 2019-09-26 DIAGNOSIS — J9611 Chronic respiratory failure with hypoxia: Secondary | ICD-10-CM

## 2019-09-26 DIAGNOSIS — R0609 Other forms of dyspnea: Secondary | ICD-10-CM

## 2019-09-26 DIAGNOSIS — J432 Centrilobular emphysema: Secondary | ICD-10-CM | POA: Diagnosis not present

## 2019-09-26 DIAGNOSIS — R0600 Dyspnea, unspecified: Secondary | ICD-10-CM | POA: Diagnosis not present

## 2019-09-26 MED ORDER — SPIRIVA RESPIMAT 2.5 MCG/ACT IN AERS: 2.0000 | INHALATION_SPRAY | Freq: Every day | RESPIRATORY_TRACT | 5 refills | Status: AC

## 2019-09-26 NOTE — Addendum Note (Signed)
Addended by: Luna Kitchens D on: 09/26/2019 11:38 AM   Modules accepted: Orders

## 2019-09-26 NOTE — Assessment & Plan Note (Addendum)
COPD and emphysema and former smoker.  Given patient's age and multiple comorbidities along with bedbound condition will be unable to perform spirometry. For now would add and Spiriva to see if it helps with her symptom burden.  Check portable chest x-ray at her facility. Patient has reported recurrent exacerbations plus or minus pneumonia.  Patient has had previous stroke with right-sided hemiparesis suspect aspiration could be a high possibility.  Would check bedside swallow evaluation with speech therapy if available at her facility otherwise we will have to consider outpatient modified barium swallow.  Aspiration precautions discussed   Plan  Patient Instructions  Portable chest xray at facility .  Begin Spiriva 2 puffs daily  Begin Flutter valve Twice daily  .  Speech therapy bedside swallow evaluation .  Follow up with [name]  In 2 months and As needed   Please contact office for sooner follow up if symptoms do not improve or worsen or seek emergency care

## 2019-09-26 NOTE — Patient Instructions (Signed)
Portable chest xray at facility .  Begin Spiriva 2 puffs daily  Begin Flutter valve Twice daily  .  Speech therapy bedside swallow evaluation .  Follow up with [name]  In 2 months and As needed   Please contact office for sooner follow up if symptoms do not improve or worsen or seek emergency care

## 2019-09-26 NOTE — Progress Notes (Signed)
@Patient  ID: , female    DOB: [birth year], 86 y.o.   MRN: [id number]  Chief Complaint  Patient presents with  . Follow-up    COPD     Referring provider: [id number]*  HPI: 86 year old female former smoker seen for pulmonary consult January 06, 2019 for emphysema, chronic respiratory failure on oxygen and possible underlying obstructive sleep apnea Resident at nursing home. Medical history significant for congestive heart failure, diabetes, previous stroke, A. fib on chronic anticoagulation  TEST/EVENTS :  CT chest 02/14/11 >> mild emphysema and subpleural fibrosis lower lobes b/l CT chest 08/23/12 >> no change  Echo 12/16/18 >> EF 76%, mod elevated RVSP  09/26/2019 Follow up : Emphysema , O2 RF  Patient returns for a follow-up visit.  Patient was seen last January 06, 2019 for a pulmonary consult for chronic hypoxic respiratory failure on oxygen.  Patient was suspected to have possible underlying obstructive sleep apnea with elevated right-sided pressures on her echo, history of A. fib and hypoxemia.  Patient was set up for an overnight oximetry test and spirometry. It does not appear that these were completed.  Shortly after last visit patient was admitted with congestive heart failure, hospital records show she was treated with IV diuresis.  And started on broad-spectrum antibiotics for possible underlying pneumonia.  Unfortunately patient experienced an acute left MCA stroke during hospitalization.  Patient was seen by palliative care and discharged on hospice of [facility].  Patient is accompanied by family member.  Says that patient was discharged from hospice care January 2021.  Is now full resident of skilled nursing facility.  She is bedridden.  Does get up in the chair but requires [facility] lift.  She has right-sided hemiparesis.  Has some intermittent speech changes.  She denies any swallow issues or choking. Since last visit patient patient has  intermittently cough and congestion.  Has been treated several times for suspected pneumonia by nursing home physician.  There is concern of why she continues to get recurrent pneumonias.  Family member and patient are unclear if she is getting chest x-rays.  But has been on several antibiotics over the last several months.  She denies any hemoptysis.  Says she has a good appetite and eats a regular diet. She was previously started on Spiriva but does not appear to be taking currently. Remains on oxygen at 2 L.     Allergies  Allergen Reactions  . Ciprofloxacin Diarrhea and Nausea Only  . Metronidazole Diarrhea and Nausea Only  . Septra [Sulfamethoxazole-Trimethoprim] Diarrhea    Immunization History  Administered Date(s) Administered  . Influenza-Unspecified 11/01/2018    Past Medical History:  Diagnosis Date  . Diabetes mellitus without complication (HCC)   . Diverticulitis   . Hypertension     Tobacco History: Social History   Tobacco Use  Smoking Status Former Smoker  . Quit date: 11/20/2018  . Years since quitting: 0.8  Smokeless Tobacco Never Used   Counseling given: Not Answered   Outpatient Medications Prior to Visit  Medication Sig Dispense Refill  . acetaminophen (TYLENOL) 325 MG tablet Take 500 mg by mouth every 8 (eight) hours as needed for moderate pain.     01/20/2019 antiseptic oral rinse (BIOTENE) LIQD 15 mLs by Mouth Rinse route every 6 (six) hours as needed for dry mouth.    Marland Kitchen apixaban (ELIQUIS) 2.5 MG TABS tablet Take 1 tablet (2.5 mg total) by mouth 2 (two) times daily. 60 tablet 6  . atorvastatin (LIPITOR)  20 MG tablet Take 20 mg by mouth daily.    . cholecalciferol (VITAMIN D) 25 MCG (1000 UT) tablet Take 1,000 Units by mouth daily.    . COMBIVENT RESPIMAT 20-100 MCG/ACT AERS respimat Inhale 1-2 puffs into the lungs every 6 (six) hours as needed for shortness of breath or wheezing.    . feeding supplement, ENSURE ENLIVE, (ENSURE ENLIVE) LIQD Take 237 mLs by  mouth 2 (two) times daily between meals. 237 mL 12  . guaifenesin (ROBITUSSIN) 100 MG/5ML syrup Take 200 mg by mouth every 4 (four) hours as needed for cough.    . hydrALAZINE (APRESOLINE) 50 MG tablet Take 1 tablet (50 mg total) by mouth 3 (three) times daily.    . Melatonin 1 MG TABS Take 6 mg by mouth at bedtime.     . metoprolol tartrate (LOPRESSOR) 25 MG tablet Take 0.5 tablets (12.5 mg total) by mouth 2 (two) times daily. 30 tablet 0  . polyethylene glycol (MIRALAX / GLYCOLAX) 17 g packet Take 17 g by mouth daily as needed.    . Skin Protectants, Misc. (CALAZIME SKIN PROTECTANT) PSTE Apply topically.    . traZODone (DESYREL) 50 MG tablet Take 25 mg by mouth at bedtime.     . vitamin C (ASCORBIC ACID) 500 MG tablet Take 1,000 mg by mouth daily.    Marland Kitchen zinc gluconate 50 MG tablet Take 50 mg by mouth daily.     No facility-administered medications prior to visit.     Review of Systems:   Constitutional:   No  weight loss, night sweats,  Fevers, chills,  +fatigue, or  lassitude.  HEENT:   No headaches,  Difficulty swallowing,  Tooth/dental problems, or  Sore throat,                No sneezing, itching, ear ache, nasal congestion, post nasal drip,   CV:  No chest pain,  Orthopnea, PND, swelling in lower extremities, anasarca, dizziness, palpitations, syncope.   GI  No heartburn, indigestion, abdominal pain, nausea, vomiting, diarrhea, change in bowel habits, loss of appetite, bloody stools.   Resp:    No chest wall deformity  Skin: no rash or lesions.  GU: no dysuria, change in color of urine, no urgency or frequency.  No flank pain, no hematuria   MS:  No joint pain or swelling.  No decreased range of motion.  No back pain.    Physical Exam  BP 118/66 (BP Location: Left Arm, Cuff Size: Normal)   Pulse 63   Temp 98.2 F (36.8 C) (Oral)   Ht 5' (1.524 m)   Wt 120 lb (54.4 kg)   SpO2 91%   BMI 23.44 kg/m   GEN: A/Ox3; pleasant , NAD, elderly patient in recliner, on  oxygen   HEENT:  [place]/AT,  OSE-clear, THROAT-clear, no lesions, no postnasal drip or exudate noted.   NECK:  Supple w/ fair ROM; no JVD; normal carotid impulses w/o bruits; no thyromegaly or nodules palpated; no lymphadenopathy.    RESP few scattered rhonchi  no accessory muscle use, no dullness to percussion  CARD:  RRR, no m/r/g, no peripheral edema, pulses intact, no cyanosis or clubbing.  GI:   Soft & nt; nml bowel sounds; no organomegaly or masses detected.   Musco: Warm bil, no deformities or joint swelling noted.   Neuro: alert, no focal deficits noted.  Right-sided weakness noted.  Speech appears normal.  Skin: Warm, no lesions or rashes    Lab Results:  ProBNP No results found for: PROBNP  Imaging: No results found.    No flowsheet data found.  No results found for: NITRICOXIDE      Assessment & Plan:   Centrilobular emphysema (HCC) COPD and emphysema and former smoker.  Given patient's age and multiple comorbidities along with bedbound condition will be unable to perform spirometry. For now would add and Spiriva to see if it helps with her symptom burden.  Check portable chest x-ray at her facility. Patient has reported recurrent exacerbations plus or minus pneumonia.  Patient has had previous stroke with right-sided hemiparesis suspect aspiration could be a high possibility.  Would check bedside swallow evaluation with speech therapy if available at her facility otherwise we will have to consider outpatient modified barium swallow.  Aspiration precautions discussed   Plan  Patient Instructions  Portable chest xray at facility .  Begin Spiriva 2 puffs daily  Begin Flutter valve Twice daily  .  Speech therapy bedside swallow evaluation .  Follow up with [name]  In 2 months and As needed   Please contact office for sooner follow up if symptoms do not improve or worsen or seek emergency care       Chronic respiratory failure with hypoxia  (HCC) Oxygen dependent respiratory.  No increased oxygen demands.  Continue on oxygen at 2 L. Previous evaluation there was some suspicion for possible underlying sleep apnea however given patient's age multiple comorbidities family and patient do not wish to proceed with sleep evaluation and does not feel like she could wear CPAP. We will continue on oxygen to keep O2 saturations greater than 88 to 90%     Rubye Oaks, NP 09/26/2019

## 2019-09-26 NOTE — Progress Notes (Signed)
Reviewed and agree with assessment/plan.   Coralyn Helling, MD [facility] Pulmonary/Critical Care 09/26/2019, 11:32 AM Pager:  [phone]

## 2019-09-26 NOTE — Assessment & Plan Note (Signed)
Oxygen dependent respiratory.  No increased oxygen demands.  Continue on oxygen at 2 L. Previous evaluation there was some suspicion for possible underlying sleep apnea however given patient's age multiple comorbidities family and patient do not wish to proceed with sleep evaluation and does not feel like she could wear CPAP. We will continue on oxygen to keep O2 saturations greater than 88 to 90%

## 2019-09-26 NOTE — Addendum Note (Signed)
Addended by: Luna Kitchens D on: 09/26/2019 11:17 AM   Modules accepted: Orders

## 2019-11-25 ENCOUNTER — Other Ambulatory Visit: Payer: Self-pay

## 2019-11-25 ENCOUNTER — Ambulatory Visit (INDEPENDENT_AMBULATORY_CARE_PROVIDER_SITE_OTHER): Payer: Medicare Other | Admitting: Pulmonary Disease

## 2019-11-25 ENCOUNTER — Encounter: Payer: Self-pay | Admitting: Pulmonary Disease

## 2019-11-25 VITALS — BP 110/60 | HR 76 | Temp 97.2°F

## 2019-11-25 DIAGNOSIS — J432 Centrilobular emphysema: Secondary | ICD-10-CM | POA: Diagnosis not present

## 2019-11-25 DIAGNOSIS — J9611 Chronic respiratory failure with hypoxia: Secondary | ICD-10-CM

## 2019-11-25 NOTE — Progress Notes (Signed)
[place] [facility], [facility]  Chief Complaint  Patient presents with  . Follow-up    Breathing is "alright"- She has occ cough- non prod.     Constitutional:  BP 110/60 (BP Location: Right Arm, Cuff Size: Normal)   Pulse 76   Temp (!) 97.2 F (36.2 C) (Temporal)   SpO2 97% Comment: 2lpm  cont o2  Past Medical History:  DM, Hyponatremia, A fib, Diverticulitus, CVA, HTN, C diff colitis July 2021, COVID 19 infection March 2021  Past Surgical History:  Her  has a past surgical history that includes Appendectomy and Tonsillectomy.  Brief Summary:  Cheryl Graves is a 86 y.o. female former smoker with centrilobular emphysema and chronic respiratory failure with hypoxia.       Subjective:   She is here with family member.  Since I last saw her she was hospitalized with CVA and pneumonia.  She was under home hospice care, but then switched to SNF.  She is paralyzed on Rt side since having CVA and needs full assistance with mobility.  She is able to feed herself.  She has occasional cough and chest congestion.  Feels breathing is okay.  Using 2 liters oxygen 24/7.  Reports using combivent once or twice per day.  Labs from 10/17/19: WBC 7.5, Hb 10.6, PL 273.  Physical Exam:   Appearance - sitting in recliner chair, wearing oxygen  ENMT - no sinus tenderness, no oral exudate, no LAN, Mallampati 3 airway, no stridor  Respiratory - equal breath sounds bilaterally, no wheezing or rales  CV - irregular, no murmurs  Ext - no clubbing, no edema  Skin - no rashes  Psych - normal mood and affect   Chest Imaging:   CT chest 02/14/11 >> mild emphysema and subpleural fibrosis lower lobes b/l  CT chest 08/23/12 >> no change  Cardiac Tests:   Echo 01/25/19 >> EF 60 to 65%, mild/mod LA dilation, mild RA dilation, mild/mod MR, RVSP 38.9 mmHg  Social History:  She  reports that she quit smoking about a year ago. She has never used smokeless tobacco. She  reports that she does not drink alcohol and does not use drugs.  Family History:  Her family history is not on file.     Assessment/Plan:   COPD from centrilobular emphysema. - continue spiriva and prn combivent - defer additional testing at this time   Chronic respiratory failure with hypoxia. - 2 liters oxygen 24/7  Hx of CVA. - she has Rt side hemiplegia and is non-ambulatory  Atrial fibrillation. - followed in [place] A fib clinic for anticoagulation  Goals of care. - DNR/DNI  Time Spent Involved in Patient Care on Day of Examination:  23 minutes  Follow up:  Patient Instructions  Follow up in 1 year   Medication List:   Allergies as of 11/25/2019      Reactions   Ciprofloxacin Diarrhea, Nausea Only   Metronidazole Diarrhea, Nausea Only   Septra [sulfamethoxazole-trimethoprim] Diarrhea      Medication List       Accurate as of November 25, 2019 10:29 AM. If you have any questions, ask your nurse or doctor.        acetaminophen 325 MG tablet Commonly known as: TYLENOL Take 500 mg by mouth every 8 (eight) hours as needed for moderate pain.   antiseptic oral rinse Liqd 15 mLs by Mouth Rinse route every 6 (six) hours as needed for dry mouth.   apixaban  2.5 MG Tabs tablet Commonly known as: Eliquis Take 1 tablet (2.5 mg total) by mouth 2 (two) times daily.   atorvastatin 20 MG tablet Commonly known as: LIPITOR Take 20 mg by mouth daily.   Calazime Skin Protectant Pste Apply topically.   cholecalciferol 25 MCG (1000 UNIT) tablet Commonly known as: VITAMIN D Take 1,000 Units by mouth daily.   Combivent Respimat 20-100 MCG/ACT Aers respimat Generic drug: Ipratropium-Albuterol Inhale 1-2 puffs into the lungs every 6 (six) hours as needed for shortness of breath or wheezing.   feeding supplement (ENSURE ENLIVE) Liqd Take 237 mLs by mouth 2 (two) times daily between meals.   guaifenesin 100 MG/5ML syrup Commonly known as: ROBITUSSIN Take 200  mg by mouth every 4 (four) hours as needed for cough.   hydrALAZINE 50 MG tablet Commonly known as: APRESOLINE Take 1 tablet (50 mg total) by mouth 3 (three) times daily.   melatonin 1 MG Tabs tablet Take 6 mg by mouth at bedtime.   metoprolol tartrate 25 MG tablet Commonly known as: LOPRESSOR Take 0.5 tablets (12.5 mg total) by mouth 2 (two) times daily.   polyethylene glycol 17 g packet Commonly known as: MIRALAX / GLYCOLAX Take 17 g by mouth daily as needed.   Spiriva Respimat 2.5 MCG/ACT Aers Generic drug: Tiotropium Bromide Monohydrate Inhale 2 puffs into the lungs daily.   traZODone 50 MG tablet Commonly known as: DESYREL Take 25 mg by mouth at bedtime.   vitamin C 500 MG tablet Commonly known as: ASCORBIC ACID Take 1,000 mg by mouth daily.   zinc gluconate 50 MG tablet Take 50 mg by mouth daily.       Signature:  Coralyn Helling, MD [facility] Pulmonary/Critical Care Pager - [phone] 11/25/2019, 10:29 AM

## 2019-11-25 NOTE — Patient Instructions (Signed)
Follow up in 1 year.

## 2020-05-14 IMAGING — CT CT ANGIO HEAD
2 of 8 series · 7 of 33 positions shown · IV contrast (APPLIED)
Comparison: None.

CLINICAL DATA: Stroke, follow-up. Additional history provided:
Acute onset right-sided facial droop, right upper and lower
extremity weakness, unable to communicate, last known normal [DATE]
p.m.

EXAM:
CT ANGIOGRAPHY HEAD AND NECK
TECHNIQUE: Multidetector CT imaging of the head and neck was performed using
the standard protocol during bolus administration of intravenous
contrast. Multiplanar CT image reconstructions and MIPs were
obtained to evaluate the vascular anatomy. Carotid stenosis
measurements (when applicable) are obtained utilizing NASCET
criteria, using the distal internal carotid diameter as the
denominator.
CONTRAST:  75mL OMNIPAQUE IOHEXOL 350 MG/ML SOLN

[Series 9: cta head neck thins · axial · 0.39mm/px · z∈[-341,-111]mm · 5 of 678 slices shown]
[im 113/678  soft-tissue]
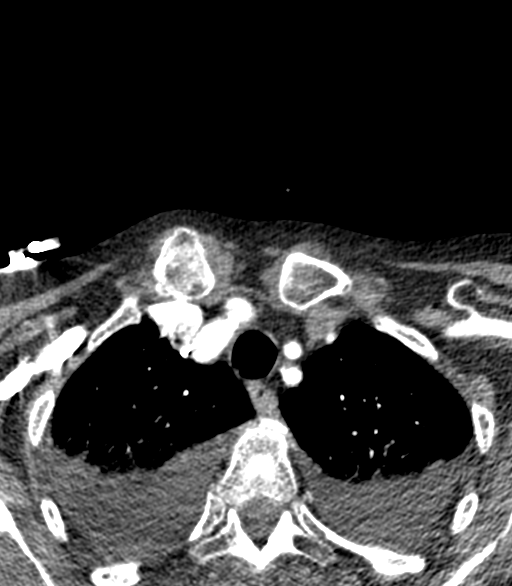
[im 226/678  bone]
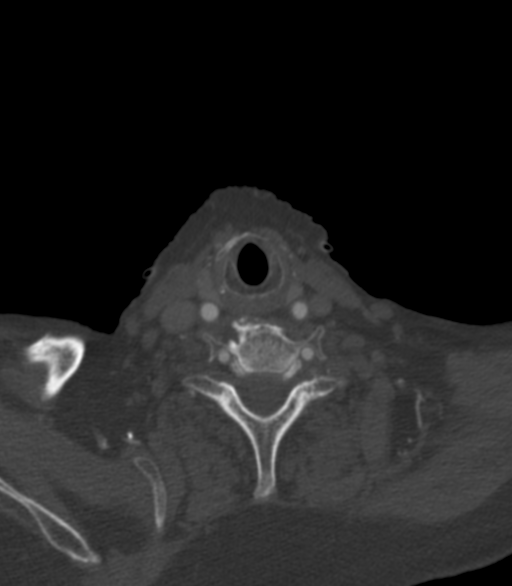
[im 339/678  soft-tissue]
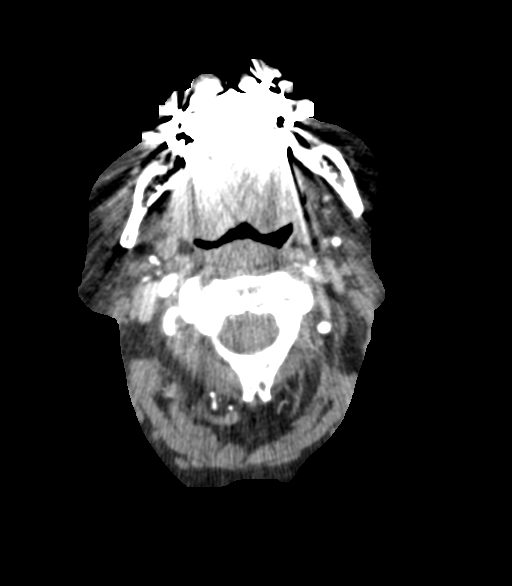
[im 452/678  bone]
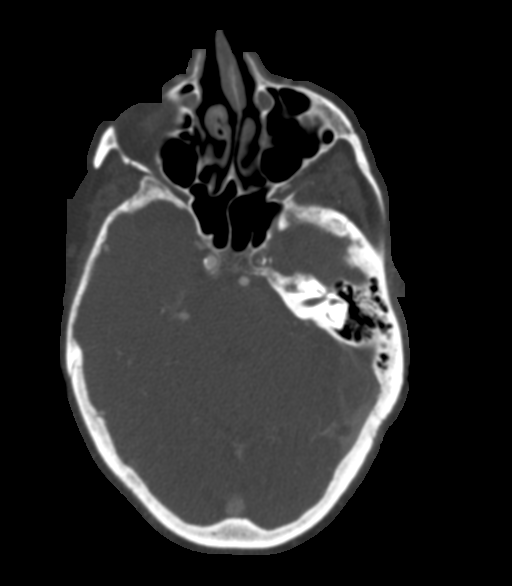
[im 565/678  soft-tissue]
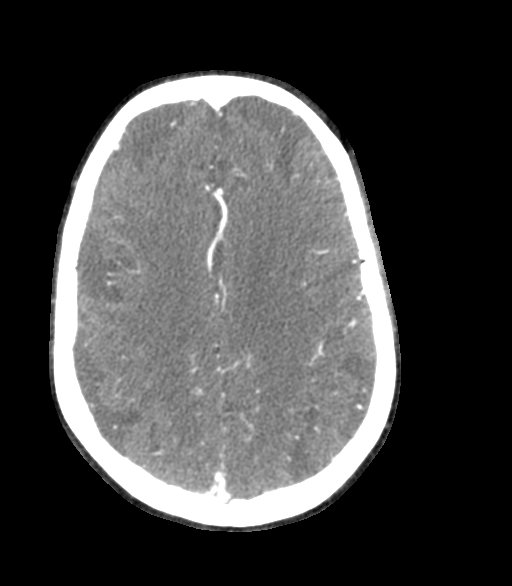

[Series 10: ax thin · axial · 0.39mm/px · z∈[-284,-167]mm · 2 of 340 slices shown]
[im 114/340  soft-tissue]
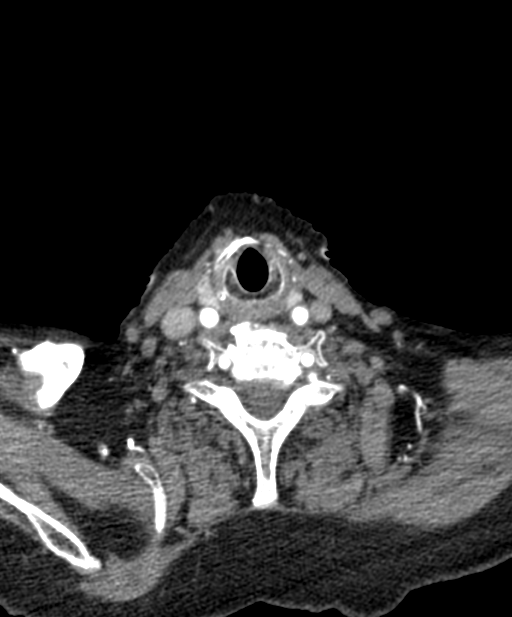
[im 227/340  soft-tissue]
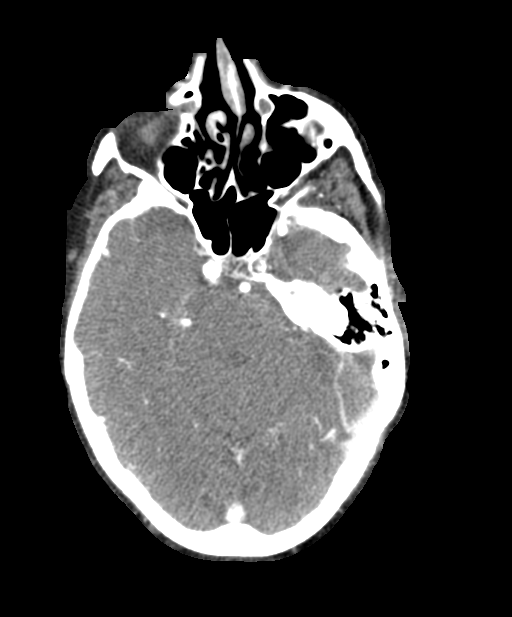

[7 of 33 positions shown; findings below may reference images not displayed]

FINDINGS: CTA NECK FINDINGS

Aortic arch: Standard aortic branching. Soft and calcified plaque
within the visualized aortic arch and proximal major branch vessels
of the neck.

Right carotid system: Proximal and mid common carotid artery patent
without significant stenosis. Prominent calcified plaque at the
carotid bifurcation extending into the proximal ICA. Estimated
stenosis of up to 70% as compared to the more distal ICA, although
this may be overestimated due to blooming artifact from prominent
calcified plaque. Distal to this, the ICA is patent within the neck
without significant stenosis.

Left carotid system: Soft and calcified plaque within the proximal
CCA with less than 50% stenosis. Prominent soft and calcified plaque
within the distal CCA extending into the left ICA origin. Resultant
high-grade stenosis at the left ICA origin. Exact degree of stenosis
is difficult to quantify due to blooming from prominent calcified
plaque. Distal to this, the ICA is markedly diminutive within the
neck. Minimal ICA enhancement throughout the siphon region and
within the supraclinoid segment. There is little flow related
enhancement within the cavernous through supraclinoid internal
carotid artery.

Vertebral arteries: The vertebral arteries are codominant. Mixed
plaque results in sites of moderate/severe stenosis within the V1
right vertebral artery and severe stenosis within the V1 left
vertebral artery. Distal to this, the bilateral vertebral arteries
are patent within the neck without significant stenosis (50% or
greater).

Skeleton: No acute bony abnormality. Cervical spondylosis with
multilevel posterior disc osteophytes, uncovertebral and facet
hypertrophy.

Other neck: No neck mass or cervical lymphadenopathy.

Upper chest: Partially imaged large bilateral pleural effusions.
Edema also present within the bilateral lung apices.

Review of the MIP images confirms the above findings

CTA HEAD FINDINGS

Anterior circulation:

There is minimal enhancement within the intracranial internal
carotid artery. Paucity of enhancement is most notable within the
paraclinoid through supraclinoid segments. The intracranial right
internal carotid artery is patent with scattered calcified plaque,
but no significant stenosis.

There is more robust reconstitution of the M1 left middle cerebral
artery, likely via the anterior communicating artery and left A1
segment. The M1 left middle cerebral artery is patent without
significant stenosis. There is occlusion of a proximal M2 MCA branch
vessel shortly beyond its origin (series 10, image 96). The left
anterior cerebral artery is patent. High-grade focal stenosis within
the proximal A1 left ACA.

The right middle and anterior cerebral arteries are patent without
significant proximal stenosis.

No intracranial aneurysm is identified.

Posterior circulation:

The intracranial vertebral arteries are patent without significant
stenosis, as is the basilar artery. The posterior cerebral arteries
are patent bilaterally without significant proximal stenosis.
Posterior communicating arteries are poorly delineated and may be
hypoplastic or absent bilaterally.

Venous sinuses: Within limitations of contrast timing, no convincing
thrombus.

Anatomic variants: As described

Review of the MIP images confirms the above findings

Findings of severe left cervical ICA stenosis with minimal
enhancement of the intracranial left internal carotid artery, as
well as proximal M2 left MCA occlusion called by telephone at the
time of interpretation on 01/27/2019 at [DATE] to provider Dr.
Kwee Keh Ming Daendels , Who verbally acknowledged these results.
IMPRESSION: CTA neck:

1. Atherosclerotic disease within the visualized aortic arch and the
branch vessels of the neck, as detailed and most notably as follows.
2. Prominent calcified plaque within the distal left CCA and
proximal ICA with high-grade stenosis of the ICA origin. Distal to
this, the ICA remains markedly diminutive within the neck.
3. Prominent calcified plaque within the distal right CCA and
proximal ICA with estimated stenosis up to 70% stenosis as compared
to the more distal ICA. However, this may be overestimated due to
blooming artifact from prominent calcified plaque. Consider carotid
artery duplex for further evaluation.
4. Mixed plaque within the V1 vertebral arteries bilaterally with
sites of moderate/severe stenosis on the right and severe stenosis
on the left.
5. Partially visualized pulmonary edema and large bilateral pleural
effusions.

CTA head:

1. Minimal enhancement within the intracranial left internal carotid
artery with paucity of enhancement most notable in the cavernous
through supraclinoid segments. More robust reconstitution of flow
within the M1 left middle cerebral artery, likely via cross flow
from the anterior communicating artery and A1 left ACA.
2. Occlusion of an M2 left middle cerebral artery branch vessel,
shortly beyond its origin.
3. High-grade focal stenosis within the proximal A1 left anterior
cerebral artery.

## 2020-05-14 IMAGING — MR MR HEAD W/O CM
7 of 8 series · 35 of 48 positions shown · non-contrast
Comparison: CT head earlier today

CLINICAL DATA: Stroke.  Right-sided weakness

EXAM:
MRI HEAD WITHOUT CONTRAST
TECHNIQUE: Multiplanar, multiecho pulse sequences of the brain and surrounding
structures were obtained without intravenous contrast.

[Series 3: DWI · axial · 3.0mm · 1.09mm/px · z∈[+13,+152]mm · 9 of 102 slices shown (1 of 3)]
[im 1/102]
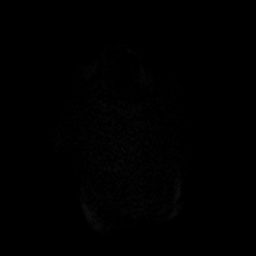
[im 19/102]
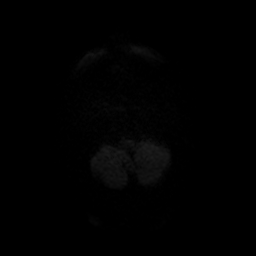
[im 28/102]
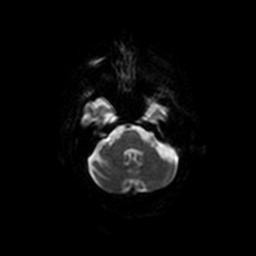
[im 46/102]
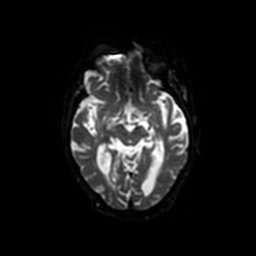
[im 56/102]
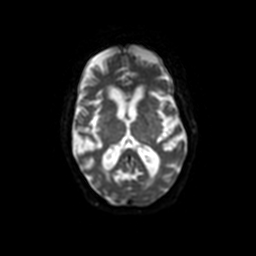
[im 74/102]
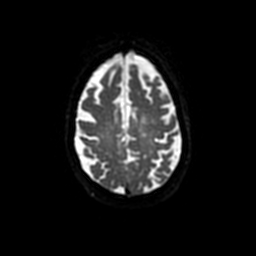
[im 83/102]
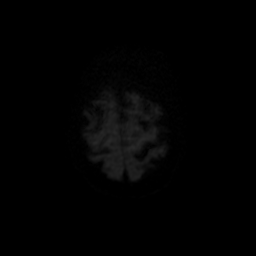
[im 92/102]
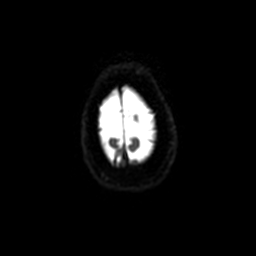
[im 102/102]
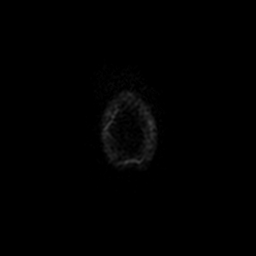

[Series 4: T1 · sagittal · 5.0mm · 0.47mm/px · 3 of 21 slices shown (1 of 2)]
[im 1/21]
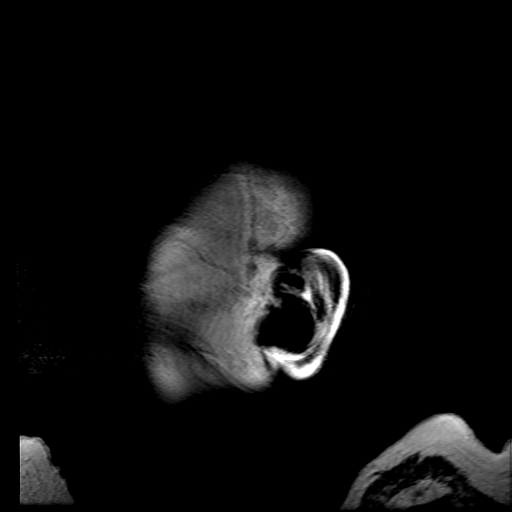
[im 11/21]
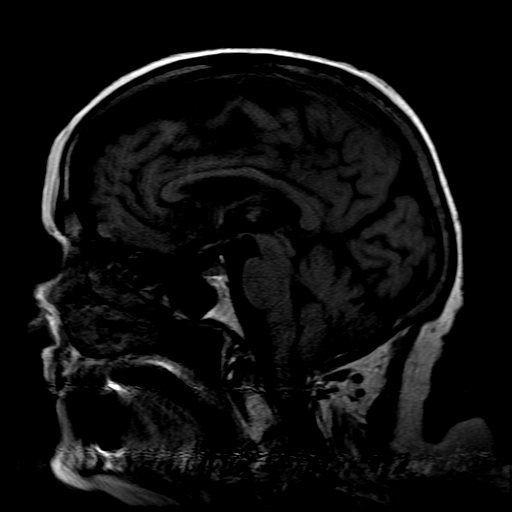
[im 21/21]
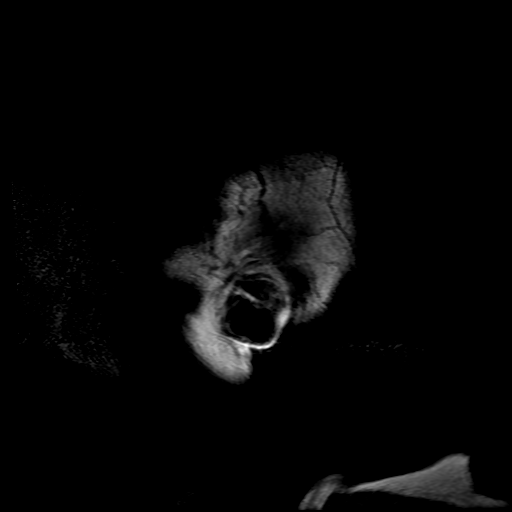

[Series 5: T2 · axial · 5.0mm · 0.43mm/px · z∈[-3,+148]mm · 4 of 28 slices shown]
[im 1/28]
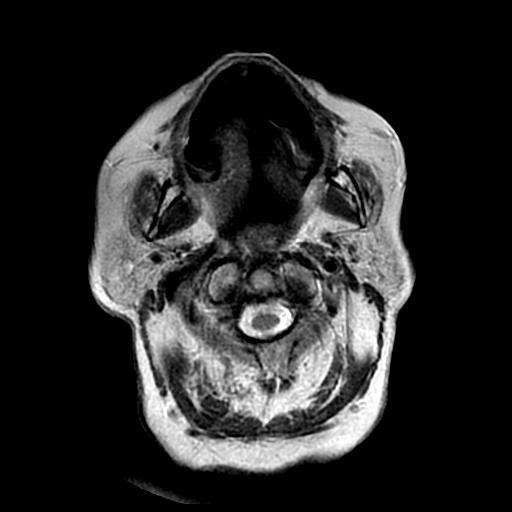
[im 10/28]
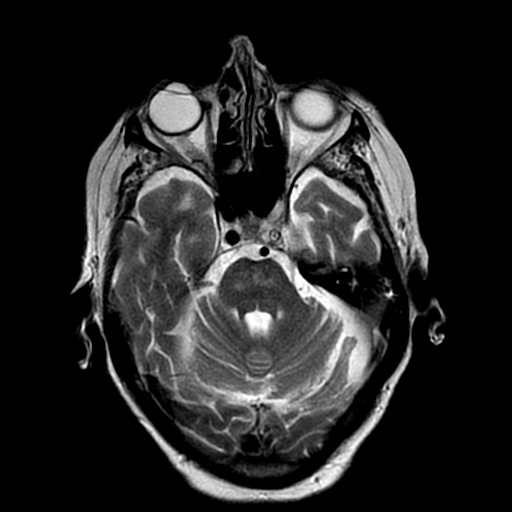
[im 19/28]
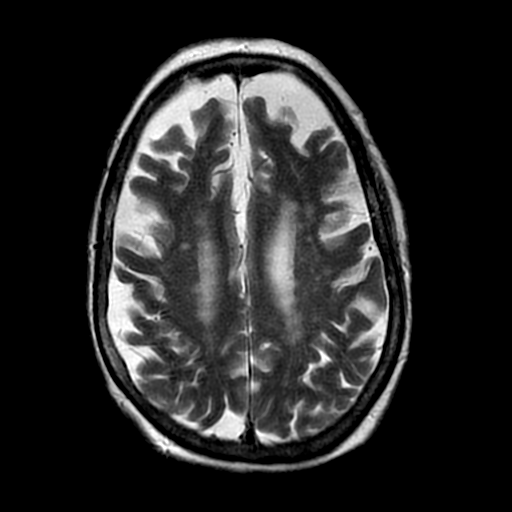
[im 28/28]
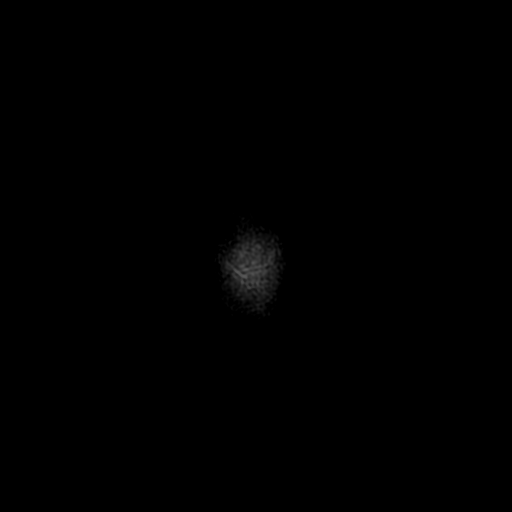

[Series 6: DWI · axial · 4.0mm · 1.17mm/px · z∈[+2,+155]mm · 9 of 74 slices shown (2 of 3)]
[im 1/74]
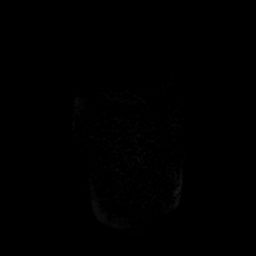
[im 10/74]
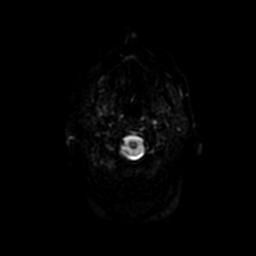
[im 19/74]
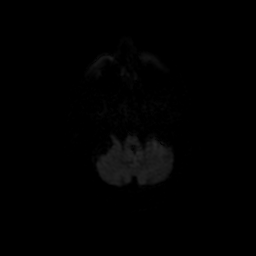
[im 28/74]
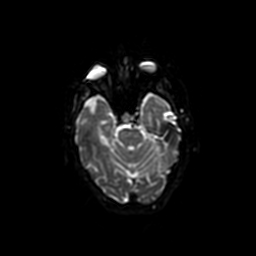
[im 37/74]
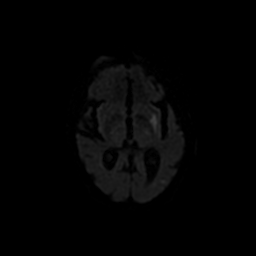
[im 46/74]
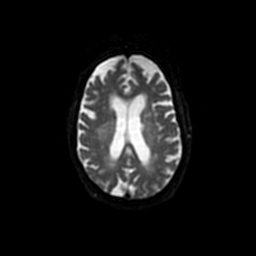
[im 55/74]
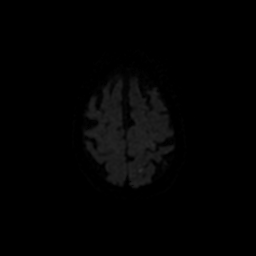
[im 64/74]
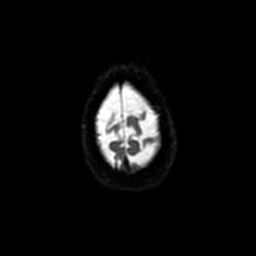
[im 74/74]
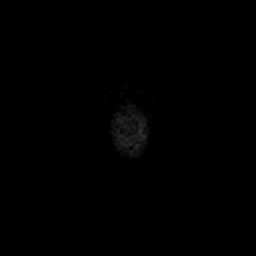

[Series 8: FLAIR · axial · 3.0mm · 0.43mm/px · z∈[-3,+148]mm · 4 of 28 slices shown]
[im 1/28]
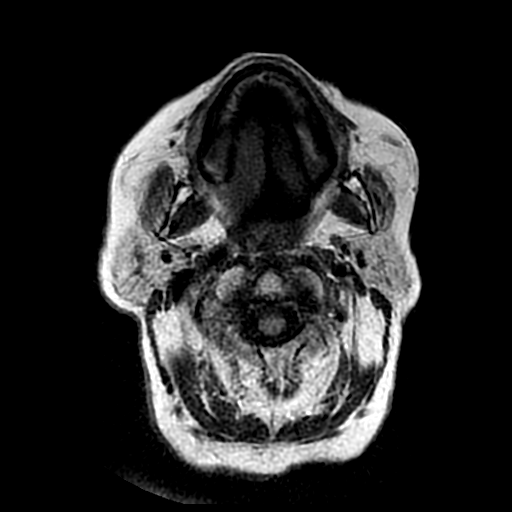
[im 10/28]
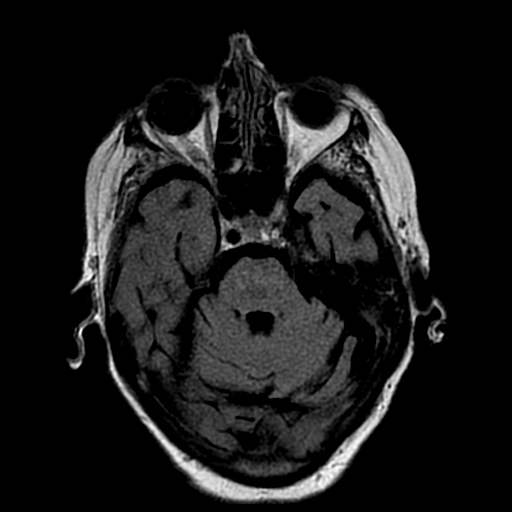
[im 19/28]
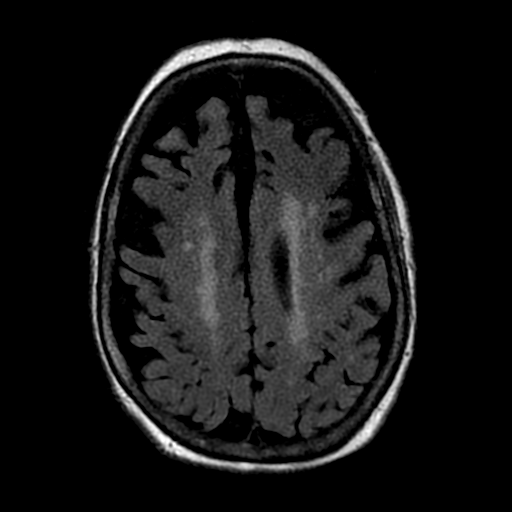
[im 28/28]
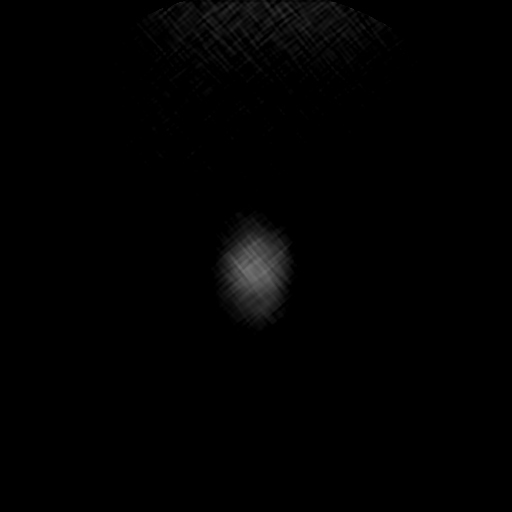

[Series 9: T1 · axial · 3.0mm · 0.47mm/px · 1 of 52 slices shown (2 of 2)]
[im 1/52]
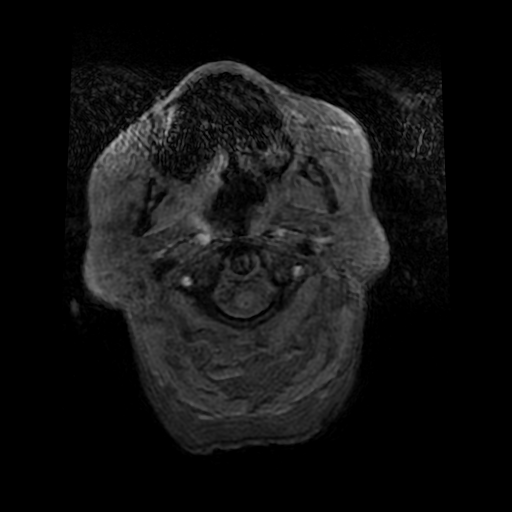

[Series 600: DWI · axial · 4.0mm · 1.17mm/px · z∈[+2,+155]mm · 5 of 37 slices shown (3 of 3)]
[im 1/37]
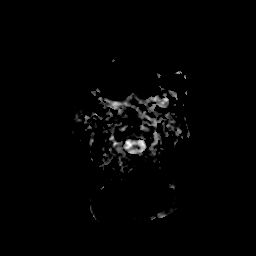
[im 10/37]
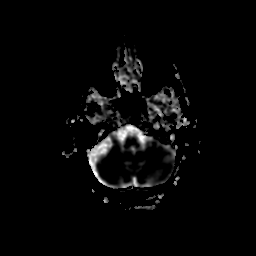
[im 19/37]
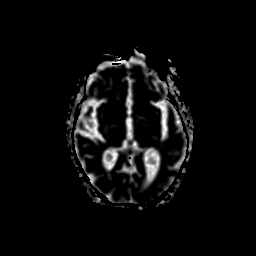
[im 28/37]
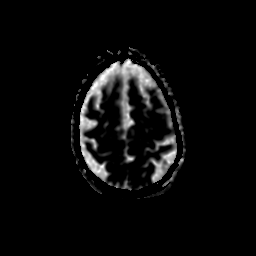
[im 37/37]
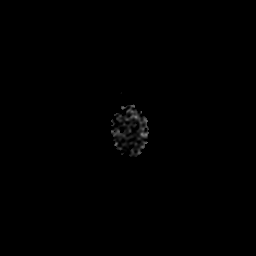

[35 of 48 positions shown; findings below may reference images not displayed]

FINDINGS: Brain: Acute infarct in the deep white matter on the left involving
the external capsule and corona radiata. Small areas of acute
infarct in the left parietal cortex over the convexity.

Moderate atrophy without hydrocephalus. No intracranial hemorrhage
or mass. Chronic microvascular ischemic changes in the white matter.
Chronic ischemia in the pons.

Vascular: Loss of flow void left internal carotid artery compatible
with slow flow or occlusion. Otherwise normal flow voids.

Skull and upper cervical spine: No focal skeletal abnormality.

Sinuses/Orbits: Bilateral cataract surgery.  Paranasal sinuses clear

Other: None
IMPRESSION: Acute infarct in the deep white matter on the left. Small areas of
acute infarct in the left parietal cortex over the convexity

Loss of flow void left internal carotid artery compatible with slow
flow or occlusion.

## 2020-12-24 ENCOUNTER — Other Ambulatory Visit: Payer: Self-pay

## 2020-12-24 ENCOUNTER — Encounter: Payer: Self-pay | Admitting: Pulmonary Disease

## 2020-12-24 ENCOUNTER — Ambulatory Visit (INDEPENDENT_AMBULATORY_CARE_PROVIDER_SITE_OTHER): Payer: Medicare Other | Admitting: Pulmonary Disease

## 2020-12-24 VITALS — BP 124/80 | HR 85 | Temp 97.3°F | Wt 154.2 lb

## 2020-12-24 DIAGNOSIS — J9611 Chronic respiratory failure with hypoxia: Secondary | ICD-10-CM

## 2020-12-24 DIAGNOSIS — J432 Centrilobular emphysema: Secondary | ICD-10-CM

## 2020-12-24 NOTE — Progress Notes (Signed)
Pulmonary, Critical Care, and Sleep Medicine  Chief Complaint  Patient presents with   Follow-up    Respiratory failure    Constitutional:  BP 124/80   Pulse 85   Temp (!) 97.3 F (36.3 C) (Oral)   Wt 154 lb 3.2 oz (69.9 kg)   SpO2 94% Comment: 2L  BMI 30.12 kg/m   Past Medical History:  DM, Hyponatremia, A fib, Diverticulitus, CVA, HTN, C diff colitis July 2021, COVID 19 infection March 2021  Past Surgical History:  Her  has a past surgical history that includes Appendectomy and Tonsillectomy.  Brief Summary:  Cheryl Graves is a 87 y.o. female former smoker with centrilobular emphysema and chronic respiratory failure with hypoxia.       Subjective:   She is here with an aide.    She uses spiriva daily.  Intermittently uses combivent.  Not having wheeze, or sputum.  She intermittently gets cough when eating.  Aide report that she hasn't needed prednisone or antibiotics recently.  Sleeping okay.  Not having chest pain or leg swelling.  Physical Exam:   Appearance - wearing oxygen, sitting in reclining wheelchair with hoyer straps  ENMT - no sinus tenderness, no oral exudate, no LAN, Mallampati 3 airway, no stridor, decreased hearing acuity  Respiratory - decreased breath sounds bilaterally, no wheezing or rales  CV - s1s2 regular rate and rhythm, no murmurs  Ext - no clubbing, no edema  Skin - no rashes  Psych - normal mood and affect    Chest Imaging:  CT chest 02/14/11 >> mild emphysema and subpleural fibrosis lower lobes b/l CT chest 08/23/12 >> no change  Cardiac Tests:  Echo 01/25/19 >> EF 60 to 65%, mild/mod LA dilation, mild RA dilation, mild/mod MR, RVSP 38.9 mmHg  Social History:  She  reports that she quit smoking about 2 years ago. Her smoking use included cigarettes. She has never used smokeless tobacco. She reports that she does not drink alcohol and does not use drugs.  Family History:  Her family history is not on file.      Assessment/Plan:   COPD from centrilobular emphysema. - continue spiriva with prn combivent - defer additional testing  Chronic respiratory failure with hypoxia. - 2 liters oxygen 24/7  Hx of CVA. - she has Rt side hemiplegia and is non-ambulatory  Atrial fibrillation. - followed in [facility] for anticoagulation  Goals of care. - DNR/DNI  Time Spent Involved in Patient Care on Day of Examination:  21 minutes  Follow up:   Patient Instructions  Follow up in 1 year  Medication List:   Allergies as of 12/24/2020       Reactions   Ciprofloxacin Diarrhea, Nausea Only   Metronidazole Diarrhea, Nausea Only   Septra [sulfamethoxazole-trimethoprim] Diarrhea        Medication List        Accurate as of December 24, 2020 10:09 AM. If you have any questions, ask your nurse or doctor.          acetaminophen 325 MG tablet Commonly known as: TYLENOL Take 500 mg by mouth every 8 (eight) hours as needed for moderate pain.   antiseptic oral rinse Liqd 15 mLs by Mouth Rinse route every 6 (six) hours as needed for dry mouth.   apixaban 2.5 MG Tabs tablet Commonly known as: Eliquis Take 1 tablet (2.5 mg total) by mouth 2 (two) times daily.   atorvastatin 20 MG tablet Commonly known as:  LIPITOR Take 20 mg by mouth daily.   Calazime Skin Protectant Pste Apply topically.   cholecalciferol 25 MCG (1000 UNIT) tablet Commonly known as: VITAMIN D Take 1,000 Units by mouth daily.   Combivent Respimat 20-100 MCG/ACT Aers respimat Generic drug: Ipratropium-Albuterol Inhale 1-2 puffs into the lungs every 6 (six) hours as needed for shortness of breath or wheezing.   feeding supplement Liqd Take 237 mLs by mouth 2 (two) times daily between meals.   guaifenesin 100 MG/5ML syrup Commonly known as: ROBITUSSIN Take 200 mg by mouth every 4 (four) hours as needed for cough.   hydrALAZINE 50 MG tablet Commonly known as: APRESOLINE Take 1 tablet (50 mg  total) by mouth 3 (three) times daily.   melatonin 1 MG Tabs tablet Take 6 mg by mouth at bedtime.   metoprolol tartrate 25 MG tablet Commonly known as: LOPRESSOR Take 0.5 tablets (12.5 mg total) by mouth 2 (two) times daily.   polyethylene glycol 17 g packet Commonly known as: MIRALAX / GLYCOLAX Take 17 g by mouth daily as needed.   Spiriva Respimat 2.5 MCG/ACT Aers Generic drug: Tiotropium Bromide Monohydrate Inhale 2 puffs into the lungs daily.   traZODone 50 MG tablet Commonly known as: DESYREL Take 25 mg by mouth at bedtime.   vitamin C 500 MG tablet Commonly known as: ASCORBIC ACID Take 1,000 mg by mouth daily.   zinc gluconate 50 MG tablet Take 50 mg by mouth daily.        Signature:  Coralyn Helling, MD [facility] Pulmonary/Critical Care Pager - [phone] 12/24/2020, 10:09 AM

## 2020-12-24 NOTE — Patient Instructions (Signed)
Follow up in 1 year.

## 2022-03-27 ENCOUNTER — Encounter (HOSPITAL_COMMUNITY): Payer: Self-pay | Admitting: *Deleted

## 2023-01-07 ENCOUNTER — Other Ambulatory Visit (HOSPITAL_COMMUNITY): Payer: Self-pay | Admitting: Adult Health

## 2023-01-07 DIAGNOSIS — R911 Solitary pulmonary nodule: Secondary | ICD-10-CM

## 2023-01-13 ENCOUNTER — Ambulatory Visit (HOSPITAL_COMMUNITY)
Admission: RE | Admit: 2023-01-13 | Discharge: 2023-01-13 | Disposition: A | Discharge status: Home / Self Care | Payer: Medicare Other | Source: Ambulatory Visit | Attending: Adult Health | Admitting: Adult Health

## 2023-01-13 DIAGNOSIS — R911 Solitary pulmonary nodule: Secondary | ICD-10-CM | POA: Diagnosis present

## 2024-01-18 DEATH — deceased
